# Patient Record
Sex: Female | Born: 1950
Health system: Southern US, Community
[De-identification: ages and names within clinical notes are randomized; demographics above are authoritative.]

## PROBLEM LIST (undated history)

## (undated) DIAGNOSIS — N189 Chronic kidney disease, unspecified: Secondary | ICD-10-CM

## (undated) DIAGNOSIS — G4730 Sleep apnea, unspecified: Secondary | ICD-10-CM

## (undated) DIAGNOSIS — I10 Essential (primary) hypertension: Secondary | ICD-10-CM

## (undated) DIAGNOSIS — E039 Hypothyroidism, unspecified: Secondary | ICD-10-CM

## (undated) DIAGNOSIS — F411 Generalized anxiety disorder: Secondary | ICD-10-CM

## (undated) DIAGNOSIS — K219 Gastro-esophageal reflux disease without esophagitis: Secondary | ICD-10-CM

## (undated) DIAGNOSIS — E109 Type 1 diabetes mellitus without complications: Secondary | ICD-10-CM

## (undated) HISTORY — DX: Sleep apnea, unspecified: G47.30

## (undated) HISTORY — DX: Hypothyroidism, unspecified: E03.9

## (undated) HISTORY — PX: KNEE ARTHROSCOPY: SHX127

## (undated) HISTORY — DX: Type 1 diabetes mellitus without complications: E10.9

## (undated) HISTORY — DX: Generalized anxiety disorder: F41.1

## (undated) HISTORY — DX: Chronic kidney disease, unspecified: N18.9

## (undated) HISTORY — DX: Gastro-esophageal reflux disease without esophagitis: K21.9

## (undated) HISTORY — DX: Essential (primary) hypertension: I10

---

## 1999-06-09 ENCOUNTER — Encounter: Admission: RE | Admit: 1999-06-09 | Discharge: 1999-09-07 | Payer: Self-pay | Admitting: Family Medicine

## 2001-12-10 ENCOUNTER — Other Ambulatory Visit: Admission: RE | Admit: 2001-12-10 | Discharge: 2001-12-10 | Payer: Self-pay | Admitting: Obstetrics and Gynecology

## 2003-09-04 ENCOUNTER — Encounter: Admission: RE | Admit: 2003-09-04 | Discharge: 2003-09-04 | Payer: Self-pay | Admitting: Cardiology

## 2005-08-18 IMAGING — CR DG CHEST 2V
2 series · 2 of 2 positions shown · non-contrast
Comparison: none

CLINICAL DATA: Chest pain.  History of asthma. 
 TWO VIEW CHEST: 
 Normal sized heart.  Minimal diffuse peribronchial thickening.  Lower thoracic spine degenerative changes and mild scoliosis.

[view not recorded (1 of 2)]
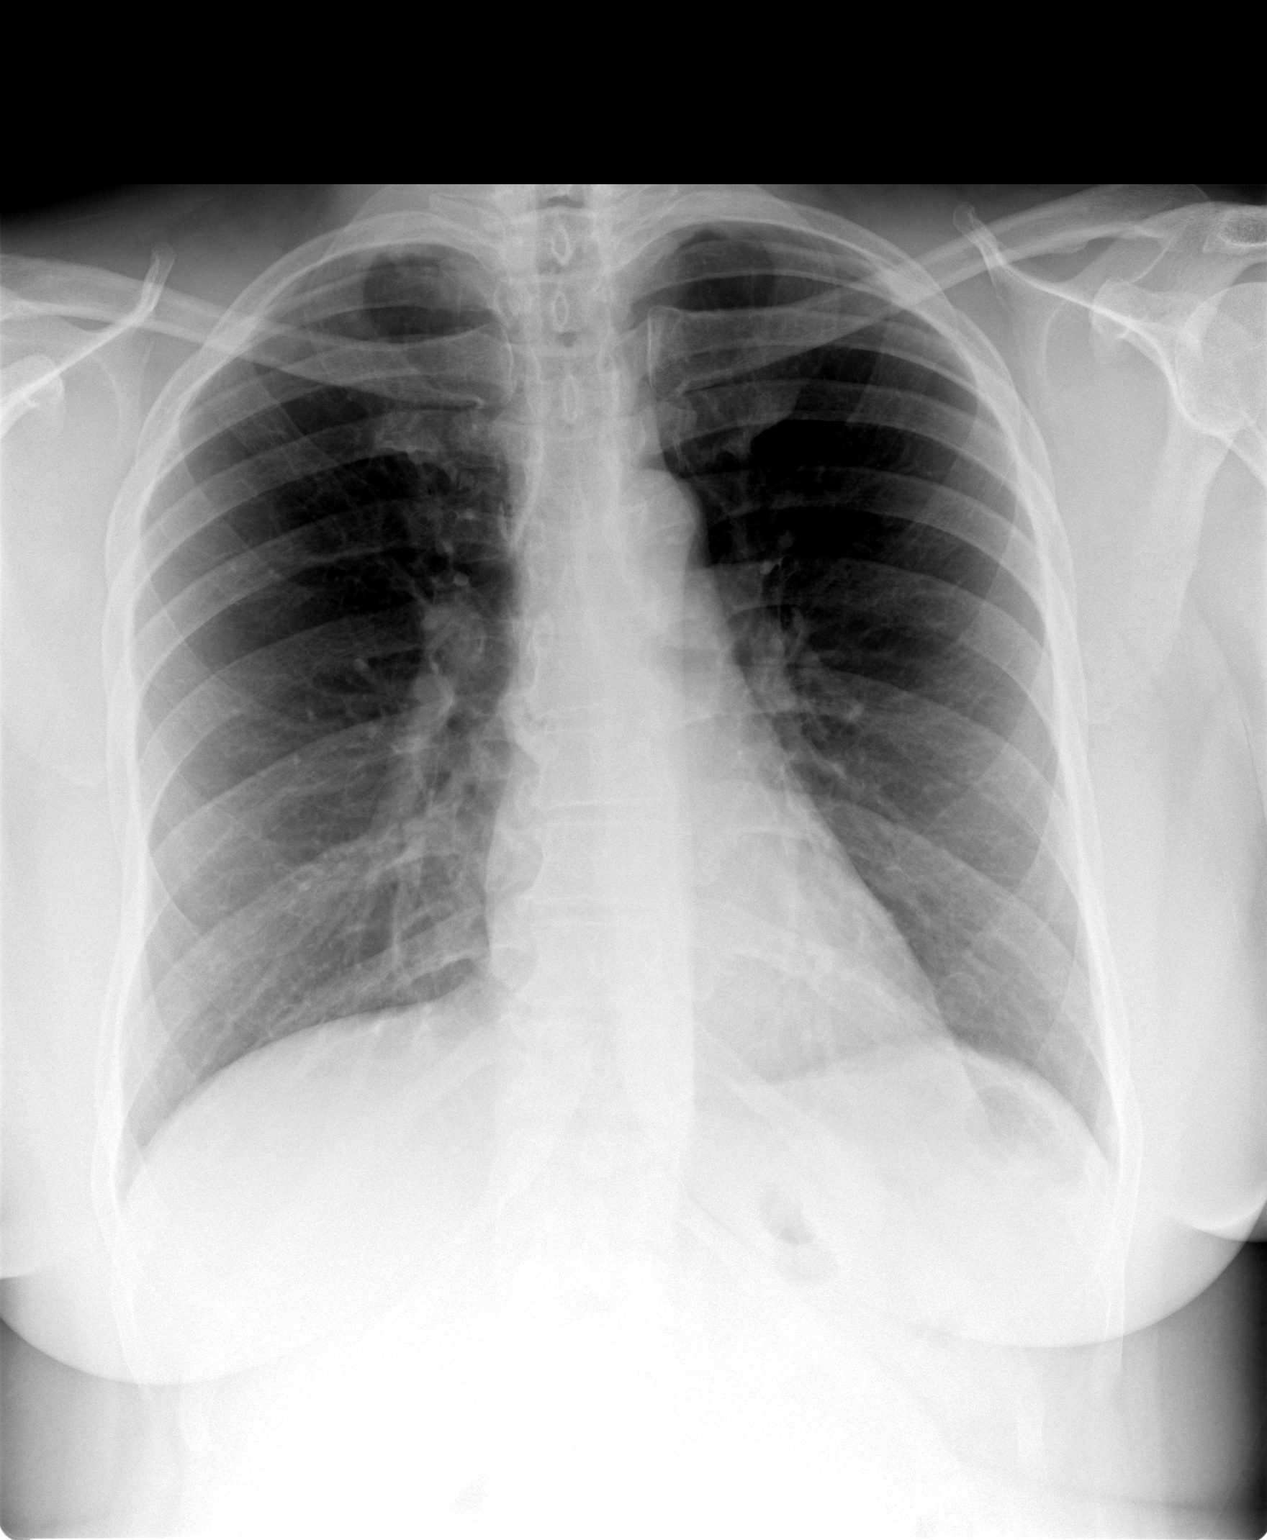

[view not recorded (2 of 2)]
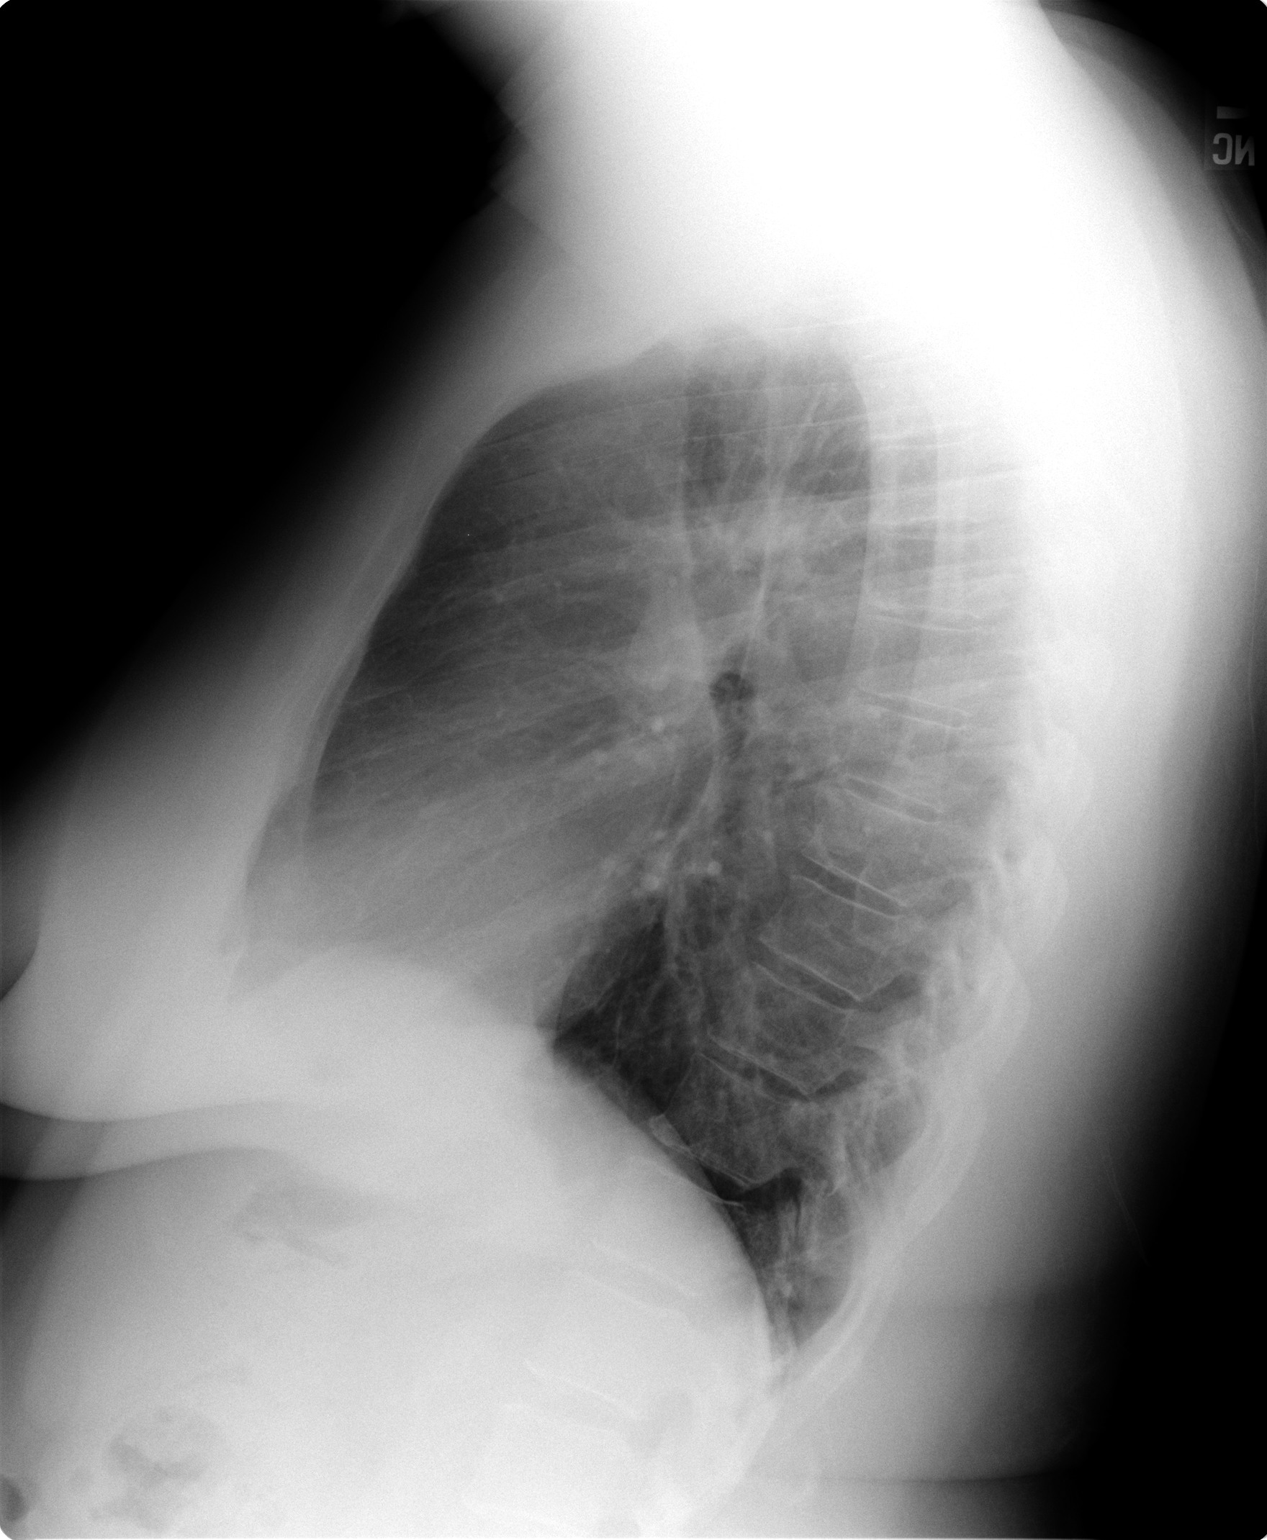

[2 of 2 positions shown; findings below may reference images not displayed]

IMPRESSION: Minimal bronchitic changes.

## 2006-06-15 ENCOUNTER — Ambulatory Visit (HOSPITAL_BASED_OUTPATIENT_CLINIC_OR_DEPARTMENT_OTHER): Admission: RE | Admit: 2006-06-15 | Discharge: 2006-06-15 | Payer: Self-pay | Admitting: Orthopedic Surgery

## 2006-06-23 ENCOUNTER — Encounter: Admission: RE | Admit: 2006-06-23 | Discharge: 2006-09-21 | Payer: Self-pay | Admitting: Orthopedic Surgery

## 2007-12-24 ENCOUNTER — Encounter: Admission: RE | Admit: 2007-12-24 | Discharge: 2007-12-24 | Payer: Self-pay | Admitting: Family Medicine

## 2008-06-16 ENCOUNTER — Encounter: Admission: RE | Admit: 2008-06-16 | Discharge: 2008-06-16 | Payer: Self-pay | Admitting: Family Medicine

## 2009-06-15 ENCOUNTER — Encounter: Payer: Self-pay | Admitting: Endocrinology

## 2009-07-17 ENCOUNTER — Encounter: Payer: Self-pay | Admitting: Endocrinology

## 2009-07-17 DIAGNOSIS — F411 Generalized anxiety disorder: Secondary | ICD-10-CM

## 2009-07-17 DIAGNOSIS — I10 Essential (primary) hypertension: Secondary | ICD-10-CM

## 2009-07-17 HISTORY — DX: Generalized anxiety disorder: F41.1

## 2009-07-17 HISTORY — DX: Essential (primary) hypertension: I10

## 2009-08-13 ENCOUNTER — Ambulatory Visit: Payer: Self-pay | Admitting: Endocrinology

## 2009-09-17 ENCOUNTER — Ambulatory Visit: Payer: Self-pay | Admitting: Endocrinology

## 2009-09-17 DIAGNOSIS — E039 Hypothyroidism, unspecified: Secondary | ICD-10-CM

## 2009-09-17 DIAGNOSIS — K219 Gastro-esophageal reflux disease without esophagitis: Secondary | ICD-10-CM

## 2009-09-17 DIAGNOSIS — E109 Type 1 diabetes mellitus without complications: Secondary | ICD-10-CM | POA: Insufficient documentation

## 2009-09-17 HISTORY — DX: Gastro-esophageal reflux disease without esophagitis: K21.9

## 2009-09-17 HISTORY — DX: Hypothyroidism, unspecified: E03.9

## 2009-09-17 HISTORY — DX: Type 1 diabetes mellitus without complications: E10.9

## 2009-11-02 ENCOUNTER — Ambulatory Visit: Payer: Self-pay | Admitting: Endocrinology

## 2009-11-24 ENCOUNTER — Ambulatory Visit: Payer: Self-pay | Admitting: Endocrinology

## 2009-12-21 ENCOUNTER — Ambulatory Visit: Payer: Self-pay | Admitting: Endocrinology

## 2010-01-21 ENCOUNTER — Ambulatory Visit: Payer: Self-pay | Admitting: Endocrinology

## 2010-02-27 ENCOUNTER — Encounter: Payer: Self-pay | Admitting: Cardiology

## 2010-03-09 NOTE — Assessment & Plan Note (Signed)
Summary: 2-3 wk f/u #/cd   Vital Signs:  Patient profile:   60 year old female Height:      66 inches (167.64 cm) Weight:      248 pounds (112.73 kg) BMI:     40.17 O2 Sat:      97 % on Room air Temp:     98.2 degrees F (36.78 degrees C) oral Pulse rate:   85 / minute BP sitting:   120 / 84  (left arm) Cuff size:   large  Vitals Entered By: Brenton Grills MA (November 24, 2009 10:47 AM)  O2 Flow:  Room air CC: 3 week F/U/aj Is Patient Diabetic? Yes   Primary Provider:  bland  CC:  3 week F/U/aj.  History of Present Illness: she brings a record of her cbg's which i have reviewed today.  she checks in am (200's) and in the afternoon (100's) only.  she feels better in general recently.    Current Medications (verified): 1)  Ferrous Sulfate 325 (65 Fe) Mg Tabs (Ferrous Sulfate) .Marland Kitchen.. 1 By Mouth Qd 2)  Nexium 40 Mg Cpdr (Esomeprazole Magnesium) .Marland Kitchen.. 1 By Mouth Qd 3)  Singulair 10 Mg Tabs (Montelukast Sodium) .Marland Kitchen.. 1 By Mouth Qhs 4)  Synthroid .Marland Kitchen.. 1 By Mouth Qd 5)  Levemir Flexpen 100 Unit/ml Soln (Insulin Detemir) .... 40 Units At Bedtime 6)  Diovan 160 Mg Tabs (Valsartan) .... Take 1 Tablet By Mouth Once Daily 7)  Novolog Flexpen 100 Unit/ml Soln (Insulin Aspart) .... Three Times A Day (Just Before Each Meal), 20-10-15 Units, and Pen Needles 4x A Day 8)  Crestor 10 Mg Tabs (Rosuvastatin Calcium) .Marland Kitchen.. 1 By Mouth Once Daily 9)  Freestyle Lite Test  Strp (Glucose Blood) .... Two Times A Day, and Lancets 250.01  Allergies (verified): 1)  ! Sulfa  Past History:  Past Medical History: Last updated: 09/17/2009 GERD (ICD-530.81) IDDM (ICD-250.01) HYPOTHYROIDISM (ICD-244.9) ANXIETY DISORDER (ICD-300.00) ESSENTIAL HYPERTENSION, BENIGN (ICD-401.1)  Review of Systems  The patient denies hypoglycemia.    Physical Exam  General:  normal appearance.   Psych:  Alert and cooperative; normal mood and affect; normal attention span and concentration.     Impression &  Recommendations:  Problem # 1:  IDDM (ICD-250.01) needs increased rx  Medications Added to Medication List This Visit: 1)  Novolog Flexpen 100 Unit/ml Soln (Insulin aspart) .... Three times a day (just before each meal), 20-10-25 units, and pen needles 4x a day  Other Orders: Est. Patient Level III (16109)  Patient Instructions: 1)  check your blood sugar 2 times a day.  vary the time of day when you check, between before the 3 meals, and at bedtime.  also check if you have symptoms of your blood sugar being too high or too low.  please keep a record of the readings and bring it to your next appointment here.  please call us sooner if you are having low blood sugar episodes. 2)  for now, continue levemir 40 units at bedtime, and increase novolog to three times a day (just before each meal) 20-10-25 units. 3)  Please schedule a follow-up appointment in 3 weeks. Prescriptions: LEVEMIR FLEXPEN 100 UNIT/ML SOLN (INSULIN DETEMIR) 40 units at bedtime  #3 boxes x 3   Entered and Authorized by:   Minus Breeding MD   Signed by:   Minus Breeding MD on 11/24/2009   Method used:   Print then Give to Patient   RxID:   [id number]  Orders Added: 1)  Est. Patient Level III [81859]

## 2010-03-09 NOTE — Miscellaneous (Signed)
Summary: Preload  Clinical Lists Changes  Problems: Added new problem of DIABETES (ICD-V18.0) Added new problem of ESSENTIAL HYPERTENSION, BENIGN (ICD-401.1) Added new problem of ANXIETY DISORDER (ICD-300.00) Medications: Added new medication of FERROUS SULFATE 325 (65 FE) MG TABS (FERROUS SULFATE) 1 by mouth qd Added new medication of FORTAMET 1000 MG XR24H-TAB (METFORMIN HCL) 2 by mouth qd Added new medication of NEXIUM 40 MG CPDR (ESOMEPRAZOLE MAGNESIUM) 1 by mouth qd Added new medication of SINGULAIR 10 MG TABS (MONTELUKAST SODIUM) 1 by mouth qhs Added new medication of * SYNTHROID 1 by mouth qd Added new medication of VICTOZA 18 MG/3ML SOLN (LIRAGLUTIDE) as directed Added new medication of ZMAX 2 GM SUSR (AZITHROMYCIN) take all as directed Allergies: Added new allergy or adverse reaction of SULFA Observations: Added new observation of MEDRECON: current updated (07/17/2009 8:29) Added new observation of PAST MED HX: Anxiety Disorder  (07/17/2009 8:29) Added new observation of PMH ANXIETY: yes (07/17/2009 8:29) Added new observation of PAST SURG HX: none (07/17/2009 8:29) Added new observation of DRUG USE: never (07/17/2009 8:29) Added new observation of SOCIAL HX: Marital Status:  Married 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  Alcohol Use - yes  (07/17/2009 8:29) Added new observation of ALCOHOL COMM: yes (07/17/2009 8:29) Added new observation of SMOK STATUS: never (07/17/2009 8:29) Added new observation of FAMILY HX: Diabetes-Mother and Brother Heart Disease-Father, Mother, and Sister Kidney Disease- Brother on dialysis  (07/17/2009 8:29) Added new observation of FH DIABETES: yes (07/17/2009 8:29) Added new observation of ALLERGY REV: Done (07/17/2009 8:29) Added new observation of NKA: F (07/17/2009 8:29)       Current Medications (verified): 1)  Ferrous Sulfate 325 (65 Fe) Mg Tabs (Ferrous Sulfate) .Marland Kitchen.. 1 By Mouth Qd 2)  Fortamet 1000 Mg Xr24h-Tab  (Metformin Hcl) .... 2 By Mouth Qd 3)  Nexium 40 Mg Cpdr (Esomeprazole Magnesium) .Marland Kitchen.. 1 By Mouth Qd 4)  Singulair 10 Mg Tabs (Montelukast Sodium) .Marland Kitchen.. 1 By Mouth Qhs 5)  Synthroid .Marland Kitchen.. 1 By Mouth Qd 6)  Victoza 18 Mg/65ml Soln (Liraglutide) .... As Directed 7)  Zmax 2 Gm Susr (Azithromycin) .... Take All As Directed  Allergies (verified): 1)  ! 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  Alcohol Use - yes Smoking Status:  never Drug Use:  never

## 2010-03-09 NOTE — Assessment & Plan Note (Signed)
Summary: 3-4 wk rov /nws   Vital Signs:  Patient profile:   60 year old female Height:      66 inches (167.64 cm) Weight:      254.13 pounds (115.51 kg) BMI:     41.17 O2 Sat:      96 % on Room air Temp:     98.1 degrees F (36.72 degrees C) oral Pulse rate:   96 / minute BP sitting:   126 / 82  (left arm) Cuff size:   large  Vitals Entered By: Brenton Grills CMA Duncan Dull) (December 21, 2009 10:13 AM)  O2 Flow:  Room air CC: Follow-up visit/aj Is Patient Diabetic? Yes   Primary Provider:  bland  CC:  Follow-up visit/aj.  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   she brings a record of her cbg's which i have reviewed today.  it varies from 180-300, with no trend throughout the day.    Current Medications (verified): 1)  Ferrous Sulfate 325 (65 Fe) Mg Tabs (Ferrous Sulfate) .Marland Kitchen.. 1 By Mouth Qd 2)  Nexium 40 Mg Cpdr (Esomeprazole Magnesium) .Marland Kitchen.. 1 By Mouth Qd 3)  Singulair 10 Mg Tabs (Montelukast Sodium) .Marland Kitchen.. 1 By Mouth Qhs 4)  Synthroid .Marland Kitchen.. 1 By Mouth Qd 5)  Levemir Flexpen 100 Unit/ml Soln (Insulin Detemir) .... 40 Units At Bedtime 6)  Diovan 160 Mg Tabs (Valsartan) .... Take 1 Tablet By Mouth Once Daily 7)  Novolog Flexpen 100 Unit/ml Soln (Insulin Aspart) .... Three Times A Day (Just Before Each Meal), 20-10-25 Units, and Pen Needles 4x A Day 8)  Crestor 10 Mg Tabs (Rosuvastatin Calcium) .Marland Kitchen.. 1 By Mouth Once Daily 9)  Freestyle Lite Test  Strp (Glucose Blood) .... Two Times A Day, and Lancets 250.01  Allergies (verified): 1)  ! Sulfa  Past History:  Past Medical History: Last updated: 09/17/2009 GERD (ICD-530.81) IDDM (ICD-250.01) HYPOTHYROIDISM (ICD-244.9) ANXIETY DISORDER (ICD-300.00) ESSENTIAL HYPERTENSION, BENIGN (ICD-401.1)  Review of Systems  The patient denies hypoglycemia.    Physical Exam  General:  obese.  no  distress  Psych:  Alert and cooperative; normal mood and affect; normal attention span and concentration.     Impression & Recommendations:  Problem # 1:  IDDM (ICD-250.01) the multiple injection insulin regimen is not working with her work schedule.  Medications Added to Medication List This Visit: 1)  Novolog Mix 70/30 Flexpen 70-30 % Susp (Insulin aspart prot & aspart) .... 60 units with breakfast, and 35 units with evening meal, and pen needles two times a day.  Other Orders: Est. Patient Level III (04540)  Patient Instructions: 1)  check your blood sugar 2 times a day.  vary the time of day when you check, between before the 3 meals, and at bedtime.  also check if you have symptoms of your blood sugar being too high or too low.  please keep a record of the readings and bring it to your next appointment here.  please call us sooner if you are having low blood sugar episodes. 2)  change both current insulins to novolog mix, 60 units with breakfast, and 35 units with the evening meal.  call sooner if blood sugar continues to be high.   3)  Please schedule a follow-up appointment in 1 month. Prescriptions: NOVOLOG MIX 70/30 FLEXPEN 70-30 % SUSP (INSULIN ASPART PROT &  ASPART) 60 units with breakfast, and 35 units with evening meal, and pen needles two times a day.  #7 vials x 3   Entered and Authorized by:   Minus Breeding MD   Signed by:   Minus Breeding MD on 12/21/2009   Method used:   Print then Give to Patient   RxID:   [id number]    Orders Added: 1)  Est. Patient Level III [02725]

## 2010-03-09 NOTE — Assessment & Plan Note (Signed)
Summary: 3 WK ROV /NWS  #   Vital Signs:  Patient profile:   60 year old female Height:      66 inches (167.64 cm) Weight:      248.25 pounds (112.84 kg) BMI:     40.21 O2 Sat:      97 % on Room air Temp:     98.3 degrees F (36.83 degrees C) oral Pulse rate:   68 / minute BP sitting:   128 / 82  (left arm) Cuff size:   large  Vitals Entered By: Brenton Grills MA (November 02, 2009 10:47 AM)  O2 Flow:  Room air CC: 3 week F/U/aj Is Patient Diabetic? Yes   Primary Provider:  bland  CC:  3 week F/U/aj.  History of Present Illness: pt says she does not like the aftertaste of metformin.  she brings a record of her cbg's which i have reviewed today.  all are in the 200's, except 100's in the afternoon.    Current Medications (verified): 1)  Ferrous Sulfate 325 (65 Fe) Mg Tabs (Ferrous Sulfate) .Marland Kitchen.. 1 By Mouth Qd 2)  Fortamet 1000 Mg Xr24h-Tab (Metformin Hcl) .... 2 By Mouth Qd 3)  Nexium 40 Mg Cpdr (Esomeprazole Magnesium) .Marland Kitchen.. 1 By Mouth Qd 4)  Singulair 10 Mg Tabs (Montelukast Sodium) .Marland Kitchen.. 1 By Mouth Qhs 5)  Synthroid .Marland Kitchen.. 1 By Mouth Qd 6)  Levemir Flexpen 100 Unit/ml Soln (Insulin Detemir) .... 40 Units At Bedtime 7)  Diovan 160 Mg Tabs (Valsartan) .... Take 1 Tablet By Mouth Once Daily 8)  Novolog Flexpen 100 Unit/ml Soln (Insulin Aspart) .Marland Kitchen.. 10 Units Three Times A Day (Just Before Each Meal), and Pen Needles 4x A Day 9)  Crestor 10 Mg Tabs (Rosuvastatin Calcium) .Marland Kitchen.. 1 By Mouth Once Daily  Allergies (verified): 1)  ! Sulfa  Past History:  Past Medical History: Last updated: 09/17/2009 GERD (ICD-530.81) IDDM (ICD-250.01) HYPOTHYROIDISM (ICD-244.9) ANXIETY DISORDER (ICD-300.00) ESSENTIAL HYPERTENSION, BENIGN (ICD-401.1)  Review of Systems  The patient denies hypoglycemia.    Physical Exam  General:  obese.  no distress  Psych:  Alert and cooperative; normal mood and affect; normal attention span and concentration.     Impression &  Recommendations:  Problem # 1:  IDDM (ICD-250.01) needs increased rx  Medications Added to Medication List This Visit: 1)  Novolog Flexpen 100 Unit/ml Soln (Insulin aspart) .... Three times a day (just before each meal), 20-10-15 units, and pen needles 4x a day 2)  Crestor 10 Mg Tabs (Rosuvastatin calcium) .Marland Kitchen.. 1 by mouth once daily 3)  Freestyle Lite Test Strp (Glucose blood) .... Two times a day, and lancets 250.01  Other Orders: Admin 1st Vaccine (47829) Flu Vaccine 47yrs + (56213) Est. Patient Level III (08657)  Patient Instructions: 1)  check your blood sugar 2 times a day.  vary the time of day when you check, between before the 3 meals, and at bedtime.  also check if you have symptoms of your blood sugar being too high or too low.  please keep a record of the readings and bring it to your next appointment here.  please call us sooner if you are having low blood sugar episodes. 2)  for now, continue levemir 40 units at bedtime, and add novolog three times a day (just before each meal) 20-10-15 units 3)  Please schedule a follow-up appointment in 2-3 weeks. Prescriptions: FREESTYLE LITE TEST  STRP (GLUCOSE BLOOD) two times a day, and lancets 250.01  #180 x 3  Entered and Authorized by:   Minus Breeding MD   Signed by:   Minus Breeding MD on 11/02/2009   Method used:   Print then Give to Patient   RxID:   [id number] LEVEMIR FLEXPEN 100 UNIT/ML SOLN (INSULIN DETEMIR) 40 units at bedtime  #3 boxes x 3   Entered and Authorized by:   Minus Breeding MD   Signed by:   Minus Breeding MD on 11/02/2009   Method used:   Print then Give to Patient   RxID:   [id number] NOVOLOG FLEXPEN 100 UNIT/ML SOLN (INSULIN ASPART) three times a day (just before each meal), 20-10-15 units, and pen needles 4x a day  #4 boxes x 3   Entered and Authorized by:   Minus Breeding MD   Signed by:   Minus Breeding MD on 11/02/2009   Method used:   Print then Give to Patient   RxID:    [id number]    Flu Vaccine Consent Questions     Do you have a history of severe allergic reactions to this vaccine? no    Any prior history of allergic reactions to egg and/or gelatin? no    Do you have a sensitivity to the preservative Thimersol? no    Do you have a past history of Guillan-Barre Syndrome? no    Do you currently have an acute febrile illness? no    Have you ever had a severe reaction to latex? no    Vaccine information given and explained to patient? yes    Are you currently pregnant? no    Lot Number:[id number]   Exp Date:08/07/2010   Site Given  Left Deltoid IMbflu

## 2010-03-09 NOTE — Assessment & Plan Note (Signed)
Summary: NEW ENDO CONSULT/DM 2/LB   Vital Signs:  Patient profile:   60 year old female Height:      66 inches (167.64 cm) Weight:      253 pounds (115.00 kg) BMI:     40.98 O2 Sat:      98 % on Room air Temp:     97.6 degrees F (36.44 degrees C) oral Pulse rate:   97 / minute BP sitting:   114 / 78  (left arm) Cuff size:   large  Vitals Entered By: Brenton Grills MA (September 17, 2009 1:21 PM)  O2 Flow:  Room air CC: New Endo pt/DM II/aj/pt is no longer taking Victoza or Zmax/aj Is Patient Diabetic? Yes   CC:  New Endo pt/DM II/aj/pt is no longer taking Victoza or Zmax/aj.  History of Present Illness: pt states 3 years h/o dm.  she is unaware of any chronic complications.  she has been on insulin x 2 years.  she takes levemir 50 units qhs.  no cbg record, but states cbg's are 200's.  it is in genreal higher as the day goes on.  she did not tolerate victoza, due to nausea. pt says her diet and exercise are "fair to good."  she says her exercise is limited by sxs.   symptomatically, pt states few years of slight right knee pain, and associated fatigue.   Current Medications (verified): 1)  Ferrous Sulfate 325 (65 Fe) Mg Tabs (Ferrous Sulfate) .Marland Kitchen.. 1 By Mouth Qd 2)  Fortamet 1000 Mg Xr24h-Tab (Metformin Hcl) .... 2 By Mouth Qd 3)  Nexium 40 Mg Cpdr (Esomeprazole Magnesium) .Marland Kitchen.. 1 By Mouth Qd 4)  Singulair 10 Mg Tabs (Montelukast Sodium) .Marland Kitchen.. 1 By Mouth Qhs 5)  Synthroid .Marland Kitchen.. 1 By Mouth Qd 6)  Victoza 18 Mg/62ml Soln (Liraglutide) .... As Directed 7)  Zmax 2 Gm Susr (Azithromycin) .... Take All As Directed 8)  Levemir Flexpen 100 Unit/ml Soln (Insulin Detemir) .... 50 Units At Bedtime 9)  Diovan 160 Mg Tabs (Valsartan) .... Take 1 Tablet By Mouth Once Daily  Allergies (verified): 1)  ! Sulfa  Past History:  Past Medical History: GERD (ICD-530.81) IDDM (ICD-250.01) HYPOTHYROIDISM (ICD-244.9) ANXIETY DISORDER (ICD-300.00) ESSENTIAL HYPERTENSION, BENIGN (ICD-401.1)  Family  History: Reviewed history from 07/17/2009 and no changes required. Diabetes-Mother and Brother Heart Disease-Father, Mother, and Sister Kidney Disease- Brother on dialysis  Social History: Reviewed history from 07/17/2009 and no changes required. 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  Alcohol Use - yes  Review of Systems       The patient complains of weight gain.         denies blurry vision, headache, chest pain, excessive diaphoresis, memory loss, depression, and menopausal sxs.  nausea is resolved since off victoza.  she has slight doe--she attributes to allergic probs and uri's.  she has polyuria, rhinorrhea, easy bruising, and leg cramps.   Physical Exam  General:  morbidly obese.  no distress  Head:  head: no deformity eyes: no periorbital swelling, no proptosis external nose and ears are normal mouth: no lesion seen Neck:  Supple without thyroid enlargement or tenderness.   Lungs:  Clear to auscultation bilaterally. Normal respiratory effort.  Heart:  Regular rate and rhythm without murmurs or gallops noted. Normal S1,S2.   Abdomen:  abdomen is soft, nontender.  no hepatosplenomegaly.   not distended.  no hernia  Msk:  muscle bulk and strength are grossly normal.  no obvious joint  swelling.  gait is normal and steady  Pulses:  dorsalis pedis intact bilat.  no carotid bruit  Extremities:  no deformity.  no ulcer on the feet.  feet are of normal color and temp.  no edema  Neurologic:  cn 2-12 grossly intact.   readily moves all 4's.   sensation is intact to touch on the feet  Skin:  normal texture and temp.  no rash.  not diaphoretic  Cervical Nodes:  No significant adenopathy.  Psych:  Alert and cooperative; normal mood and affect; normal attention span and concentration.   Additional Exam:  outside test results are reviewed:  a1c (may, 2011) 11%   Impression & Recommendations:  Problem # 1:  IDDM  (ICD-250.01)  Orders: Consultation Level IV (04540)  Problem # 2:  knee pain this limits exercise rx  Problem # 3:  nausea was apparently due to victoza  Medications Added to Medication List This Visit: 1)  Levemir Flexpen 100 Unit/ml Soln (Insulin detemir) .... 50 units at bedtime 2)  Levemir Flexpen 100 Unit/ml Soln (Insulin detemir) .... 40 units at bedtime 3)  Diovan 160 Mg Tabs (Valsartan) .... Take 1 tablet by mouth once daily 4)  Novolog Flexpen 100 Unit/ml Soln (Insulin aspart) .Marland Kitchen.. 10 units three times a day (just before each meal), and pen needles 4x a day  Patient Instructions: 1)  good diet and exercise habits significanly improve the control of your diabetes.  please let me know if you wish to be referred to a dietician.  you should also consider weight-loss surgery.  high blood sugar is very risky to your health.  you should see an eye doctor every year. 2)  controlling your blood pressure and cholesterol drastically reduces the damage diabetes does to your body.  this also applies to quitting smoking.  please discuss these with your doctor.  you should take an aspirin every day, unless you have been advised by a doctor not to. 3)  check your blood sugar 2 times a day.  vary the time of day when you check, between before the 3 meals, and at bedtime.  also check if you have symptoms of your blood sugar being too high or too low.  please keep a record of the readings and bring it to your next appointment here.  please call us sooner if you are having low blood sugar episodes. 4)  for now, reduce levemir to 40 units at bedtime, and add novolog 10 units three times a day (just before each meal) 5)  Please schedule a follow-up appointment in 3 weeks. Prescriptions: LEVEMIR FLEXPEN 100 UNIT/ML SOLN (INSULIN DETEMIR) 40 units at bedtime  #1 box x 3   Entered and Authorized by:   Minus Breeding MD   Signed by:   Minus Breeding MD on 09/17/2009   Method used:   Print then Give to  Patient   RxID:   [id number] NOVOLOG FLEXPEN 100 UNIT/ML SOLN (INSULIN ASPART) 10 units three times a day (just before each meal), and pen needles 4x a day  #3 boxes x 3   Entered and Authorized by:   Minus Breeding MD   Signed by:   Minus Breeding MD on 09/17/2009   Method used:   Print then Give to Patient   RxID:   [id number]

## 2010-05-31 IMAGING — CR DG HAND COMPLETE 3+V*R*
3 series · 3 of 3 positions shown · non-contrast
Comparison: None.

CLINICAL DATA: Fall with swelling and pain in the first and second
digits.

RIGHT HAND - COMPLETE 3+ VIEW

[view not recorded (1 of 3)]
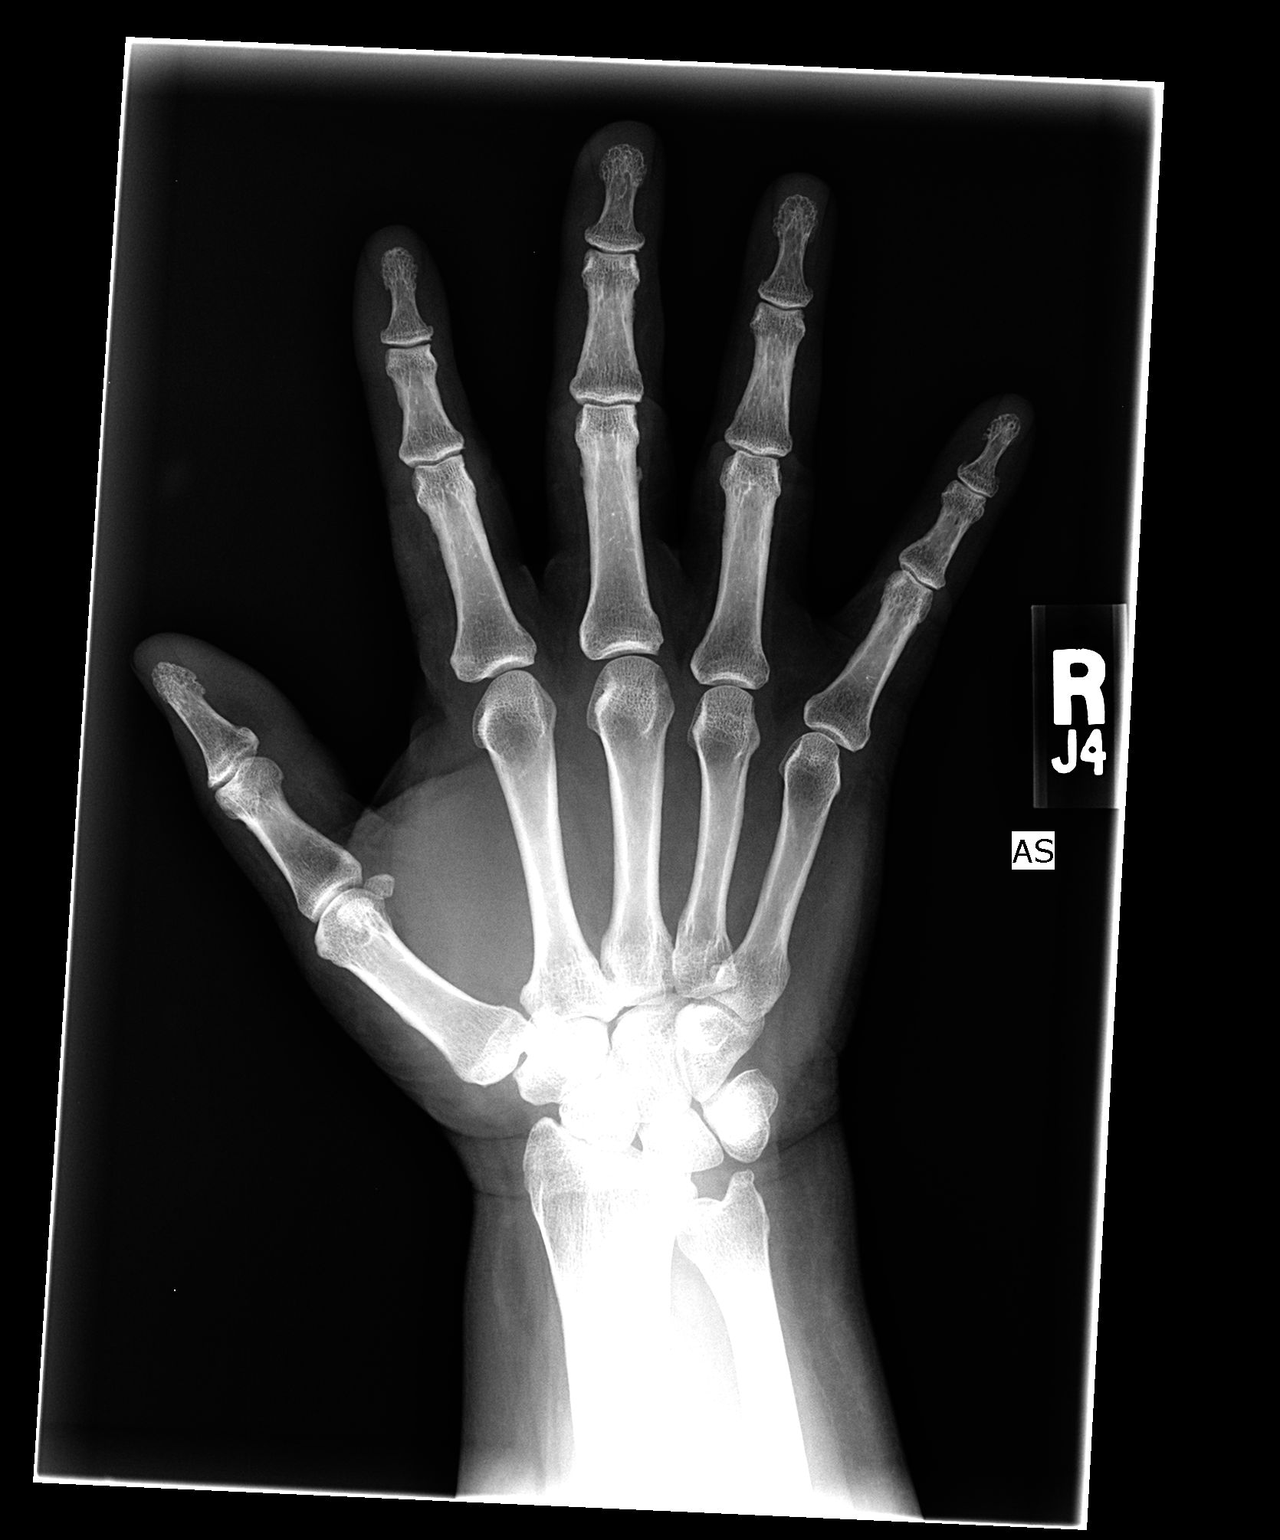

[view not recorded (2 of 3)]
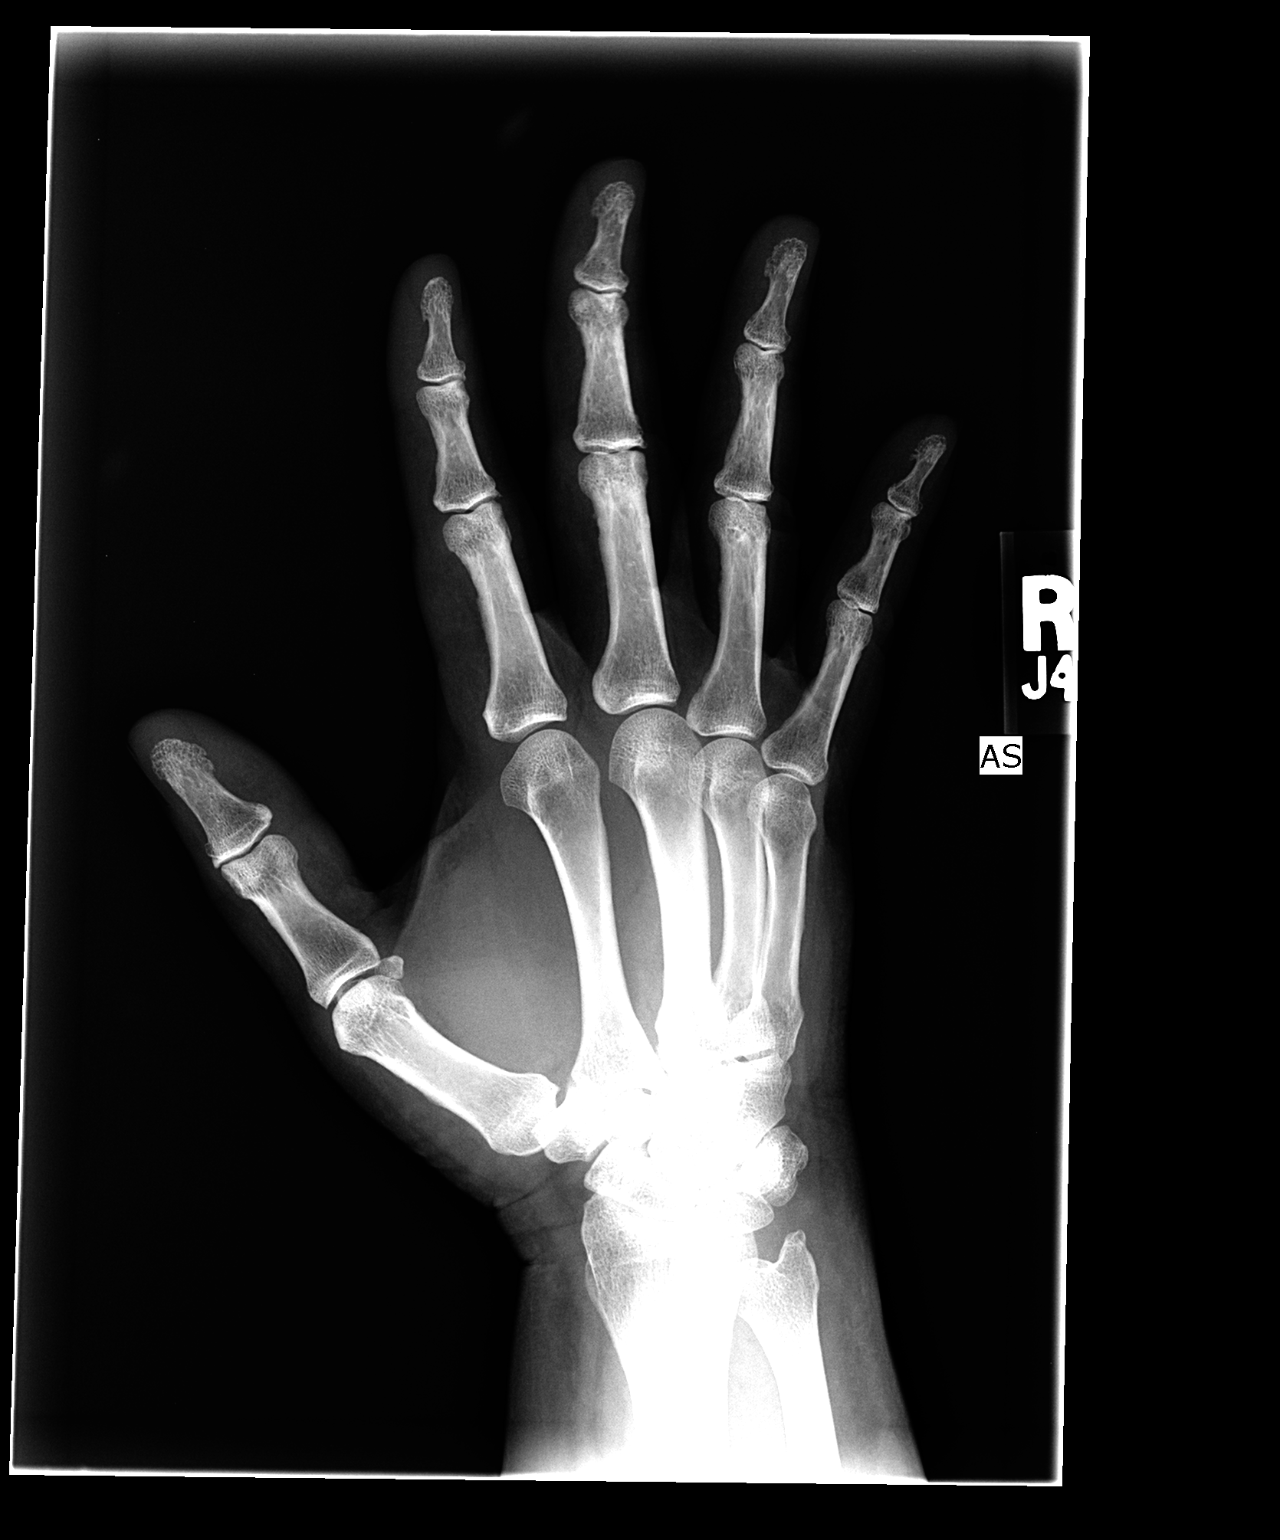

[view not recorded (3 of 3)]
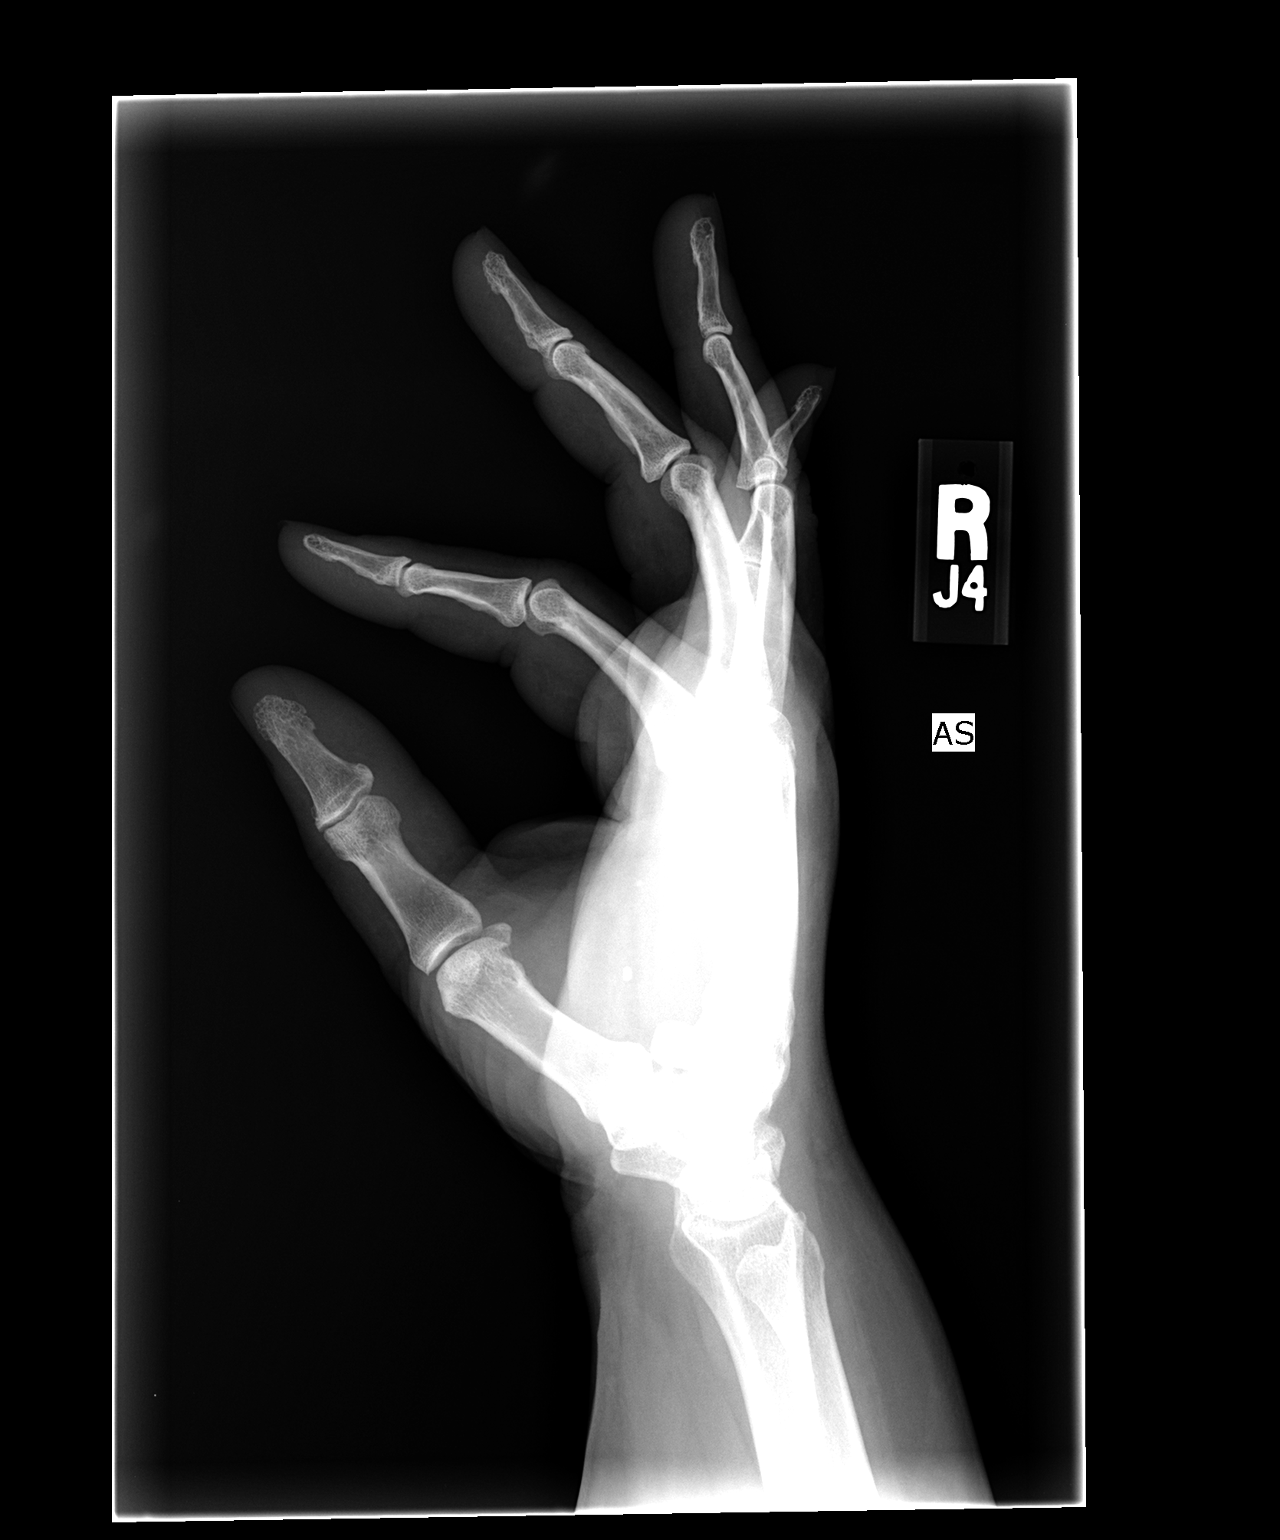

[3 of 3 positions shown; findings below may reference images not displayed]

FINDINGS: Soft tissues of the first, second, third and fourth
fingers are edematous.  No underlying fracture.  There may be mild
degenerative changes in the second and third proximal
interphalangeal joints.
IMPRESSION: Soft tissue swelling without acute fracture.

## 2010-06-22 NOTE — Op Note (Signed)
Carrie Solis, Carrie Solis               ACCOUNT NO.:  [id number]   MEDICAL RECORD NO.:  [id number]          PATIENT TYPE:  AMB   LOCATION:  DSC                          FACILITY:  [facility]   PHYSICIAN:  Rodney A. Mortenson, M.D.DATE OF BIRTH:  Apr 08, 1950   DATE OF PROCEDURE:  06/15/2006  DATE OF DISCHARGE:                               OPERATIVE REPORT   JUSTIFICATION:  A 60 year old female with a 5 to 6 week history of  diffuse right knee pain.  No injury that she can recall.  She does have  trouble with stairs. When sits for any length of time is quite  comfortable.  Turning right or left suddenly causes pain in the knee.  There is acute tenderness along the mid-third and posterior third of the  medial joint line.  MRI is does and this shows tear of the posterior  horn, medial meniscus which extends on the inferior surface.  Some  degenerative changes seen throughout the knee.  Persistent pain and  discomfort without resolution.  She is now admitted for arthroscopic  evaluation and treatment.  Complications discussed preoperatively.  Questions answered and encouraged.   Justification of patient's surgical were ________.   PREOPERATIVE DIAGNOSIS:  Tear posterior horn medial meniscus, right knee  inferior surface.   POSTOPERATIVE DIAGNOSIS:  Tear posterior horn medial meniscus, right  knee inferior surface.   OPERATION:  Partial medial meniscectomy, right knee.   SURGEON:  Lenard Galloway. Chaney Malling, M.D.   ANESTHESIA:  MAC.   FINDINGS:  With the arthroscope in the knee very careful examination of  the knee was undertaken.  The patellofemoral junction is visualized with  normal articular cartilage on both sides of the joint.  The lateral  compartment was visualized.  Normal articular cartilage lateral femoral  condyle and lateral tibial plateau and entire circumference of the  lateral meniscus was normal.  In the intercondylar notch area the ACL  was normal.  In the medial compartment the  anterior 2/3 of the medial  meniscus was normal but the superior surface of the posterior horn  appeared normal but underneath the horn it was elevated.  There were  multiple horizontal tears of the inferior surface of the meniscus which  extended out to the capsule.  This was markedly for being torn in this  area.   DESCRIPTION OF PROCEDURE:  The patient was placed on the operating table  in the supine position.  A pneumatic tourniquet about the right upper  thigh.  The entire right lower extremity was prepped with DuraPrep,  draped down in the usual manner.  Marcaine placed in knee and Xylocaine  and epinephrine used to infiltrate the skin wounds.  An infusion cannula  was placed in the superior medial pouch and the knee distended with  saline.  Anteromedian and anterior portals made and the arthroscope was  introduced.  The findings are described as above.  All the pathology  seen in the medial compartment.  Through both medial and lateral portals  a series of baskets were inserted and the posterior horn is very  aggressively debrided.  Once this  was debulked to my satisfaction.  The  intra-articular shaver was introduced.  All debris was removed.  The  remaining remnants ________ mid-third of the  medial meniscus.  Excellent decompression was then achieved.  Marcaine was then placed and  a large _______ of dressing applied, and the patient returned to the  recovery room in excellent condition.  The patient did extremely well  and was very pleased with the surgical outcome.   DISPOSITION:  1. Usual postoperative instructions given.  2. Percocet for pain.  3. Return to my office on Wednesday.           ______________________________  Lenard Galloway. Chaney Malling, M.D.     RAM/MEDQ  D:  06/15/2006  T:  06/15/2006  Job:  [id number]

## 2010-08-27 ENCOUNTER — Encounter: Payer: Self-pay | Admitting: Endocrinology

## 2010-08-27 ENCOUNTER — Other Ambulatory Visit (INDEPENDENT_AMBULATORY_CARE_PROVIDER_SITE_OTHER)

## 2010-08-27 ENCOUNTER — Ambulatory Visit (INDEPENDENT_AMBULATORY_CARE_PROVIDER_SITE_OTHER): Admitting: Endocrinology

## 2010-08-27 VITALS — BP 126/82 | HR 87 | Temp 98.7°F | Ht 66.0 in | Wt 265.0 lb

## 2010-08-27 DIAGNOSIS — E109 Type 1 diabetes mellitus without complications: Secondary | ICD-10-CM

## 2010-08-27 LAB — HEMOGLOBIN A1C: Hgb A1c MFr Bld: 12.9 % — ABNORMAL HIGH (ref 4.6–6.5)

## 2010-08-27 MED ORDER — INSULIN ASPART PROT & ASPART (70-30 MIX) 100 UNIT/ML ~~LOC~~ SUSP: SUBCUTANEOUS | Status: DC

## 2010-08-27 NOTE — Progress Notes (Signed)
  Subjective:    Patient ID: Carrie Solis, female    DOB: [birth year], 60 y.o.   MRN: [id number]  HPI Pt was ill for 3 mos with a series of resp illnesses over the winter.  She was on prednisone, which increased her cbg's.  She has been off prednisone x 5 months.  no cbg record, but states since off the prednisone, cbg's have been 170-230.  She says cbg's are in general higher as the day goes on.   Past Medical History  Diagnosis Date  . GERD 09/17/2009  . HYPOTHYROIDISM 09/17/2009  . IDDM 09/17/2009  . ANXIETY DISORDER 07/17/2009  . Essential hypertension, benign 07/17/2009  . Asthma     No past surgical history on file.  History   Social History  . Marital Status: Married    Spouse Name: N/A    Number of Children: N/A  . Years of Education: N/A   Occupational History  . Social Worker    Social History Main Topics  . Smoking status: Never Smoker   . Smokeless tobacco: Not on file  . Alcohol Use: Yes  . Drug Use:   . Sexually Active: Not Currently   Other Topics Concern  . Not on file   Social History Narrative  . No narrative on file    No current outpatient prescriptions on file prior to visit.    Allergies  Allergen Reactions  . Sulfonamide Derivatives     REACTION: rash    Family History  Problem Relation Age of Onset  . Diabetes Mother   . Heart disease Mother   . Heart disease Father   . Heart disease Sister   . Diabetes Brother   . Kidney disease Brother     on dialysis    BP 126/82  Pulse 87  Temp(Src) 98.7 F (37.1 C) (Oral)  Ht 5\' 6"  (1.676 m)  Wt 265 lb (120.203 kg)  BMI 42.77 kg/m2  SpO2 96% Review of Systems denies hypoglycemia.    Objective:   Physical Exam GENERAL: no distress.  Obese Pulses: dorsalis pedis intact bilat.   Feet: no deformity.  no ulcer on the feet.  feet are of normal color and temp.  no edema Neuro: sensation is intact to touch on the feet     Lab Results  Component Value Date   HGBA1C 12.9* 08/27/2010    Assessment & Plan:  Dm, needs increased rx

## 2010-08-27 NOTE — Patient Instructions (Addendum)
You should continue working towards the weight-loss surgery. blood tests are being ordered for you today.  please call [phone] to hear your test results.  You will be prompted to enter the 9-digit "MRN" number that appears at the top left of this page, followed by #.  Then you will hear the message. pending the test results, please increase the novolog mix to 75 units with breakfast, and 35 units with the evening meal. Please make a follow-up appointment in 3 months. good diet and exercise habits significanly improve the control of your diabetes.  please let me know if you wish to be referred to a dietician.  high blood sugar is very risky to your health.  you should see an eye doctor every year. controlling your blood pressure and cholesterol drastically reduces the damage diabetes does to your body.  this also applies to quitting smoking.  please discuss these with your doctor.  you should take an aspirin every day, unless you have been advised by a doctor not to. (update: i left message on phone-tree:  Increase to 85 units am and 45 units pm).

## 2011-08-25 DIAGNOSIS — G4733 Obstructive sleep apnea (adult) (pediatric): Secondary | ICD-10-CM | POA: Insufficient documentation

## 2011-08-25 DIAGNOSIS — I10 Essential (primary) hypertension: Secondary | ICD-10-CM | POA: Insufficient documentation

## 2011-08-25 DIAGNOSIS — E1169 Type 2 diabetes mellitus with other specified complication: Secondary | ICD-10-CM | POA: Insufficient documentation

## 2011-08-25 DIAGNOSIS — E785 Hyperlipidemia, unspecified: Secondary | ICD-10-CM | POA: Insufficient documentation

## 2011-09-27 ENCOUNTER — Ambulatory Visit (INDEPENDENT_AMBULATORY_CARE_PROVIDER_SITE_OTHER): Admitting: Endocrinology

## 2011-09-27 ENCOUNTER — Encounter: Payer: Self-pay | Admitting: Endocrinology

## 2011-09-27 VITALS — BP 122/80 | HR 89 | Temp 97.9°F | Ht 66.0 in | Wt 249.0 lb

## 2011-09-27 DIAGNOSIS — E109 Type 1 diabetes mellitus without complications: Secondary | ICD-10-CM

## 2011-09-27 MED ORDER — INSULIN DETEMIR 100 UNIT/ML ~~LOC~~ SOLN: 140.0000 [IU] | SUBCUTANEOUS | Status: DC

## 2011-09-27 MED ORDER — GLUCOSE BLOOD VI STRP: ORAL_STRIP | Status: AC

## 2011-09-27 NOTE — Progress Notes (Signed)
Subjective:    Patient ID: Carrie Solis, female    DOB: [birth year], 61 y.o.   MRN: [id number]  HPI Pt returns for f/u of insulin-requiring DM (dx'ed 2008; no known complications).  pt states she feels well in general.  She is working towards having gastric bypass surgery.  no cbg record, but states cbg's are in the 200's and 300's.  There is no trend throughout the day. She has been taking just 60 units qam and 45 units qpm.   Past Medical History  Diagnosis Date  . GERD 09/17/2009  . HYPOTHYROIDISM 09/17/2009  . IDDM 09/17/2009  . ANXIETY DISORDER 07/17/2009  . Essential hypertension, benign 07/17/2009  . Asthma     No past surgical history on file.  History   Social History  . Marital Status: Married    Spouse Name: N/A    Number of Children: N/A  . Years of Education: N/A   Occupational History  . Social Worker    Social History Main Topics  . Smoking status: Never Smoker   . Smokeless tobacco: Not on file  . Alcohol Use: Yes  . Drug Use:   . Sexually Active: Not Currently   Other Topics Concern  . Not on file   Social History Narrative  . No narrative on file    Current Outpatient Prescriptions on File Prior to Visit  Medication Sig Dispense Refill  . albuterol (PROVENTIL) 90 MCG/ACT inhaler Inhale 2 puffs into the lungs as needed.        . budesonide-formoterol (SYMBICORT) 160-4.5 MCG/ACT inhaler Inhale 2 puffs into the lungs 2 (two) times daily.        Marland Kitchen esomeprazole (NEXIUM) 40 MG capsule Take 40 mg by mouth daily before breakfast.        . Ferrous Sulfate (IRON) 325 (65 FE) MG TABS Take 1 tablet by mouth daily.        Marland Kitchen levothyroxine (SYNTHROID, LEVOTHROID) 100 MCG tablet Take 100 mcg by mouth daily.        . montelukast (SINGULAIR) 10 MG tablet Take 10 mg by mouth at bedtime.        . rosuvastatin (CRESTOR) 10 MG tablet Take 10 mg by mouth daily.        . valsartan (DIOVAN) 160 MG tablet Take 160 mg by mouth daily.        . insulin detemir (LEVEMIR FLEXPEN)  100 UNIT/ML injection Inject 140 Units into the skin every morning. And len needles 1/day  60 mL  12    Allergies  Allergen Reactions  . Sulfonamide Derivatives     REACTION: rash    Family History  Problem Relation Age of Onset  . Diabetes Mother   . Heart disease Mother   . Heart disease Father   . Heart disease Sister   . Diabetes Brother   . Kidney disease Brother     on dialysis   BP 122/80  Pulse 89  Temp 97.9 F (36.6 C) (Oral)  Ht 5\' 6"  (1.676 m)  Wt 249 lb (112.946 kg)  BMI 40.19 kg/m2  SpO2 97%  Review of Systems denies hypoglycemia.      Objective:   Physical Exam VITAL SIGNS:  See vs page GENERAL: no distress Pulses: dorsalis pedis intact bilat.   Feet: no deformity.  no ulcer on the feet.  feet are of normal color and temp.  Trace bilat leg edema Neuro: sensation is intact to touch on the feet.    Lab  Results  Component Value Date   HGBA1C 12.9* 08/27/2010      Assessment & Plan:  DM.  She may need a simpler regimen still

## 2011-09-27 NOTE — Patient Instructions (Addendum)
good diet and exercise habits significanly improve the control of your diabetes.  please let me know if you wish to be referred to a dietician.  high blood sugar is very risky to your health.  you should see an eye doctor every year.  You are at higher than average risk for pneumonia and hepatitis-B.  You should be vaccinated against both.   controlling your blood pressure and cholesterol drastically reduces the damage diabetes does to your body.  this also applies to quitting smoking.  please discuss these with your doctor.  you should take an aspirin every day, unless you have been advised by a doctor not to. check your blood sugar twice a day.  vary the time of day when you check, between before the 3 meals, and at bedtime.  also check if you have symptoms of your blood sugar being too high or too low.  please keep a record of the readings and bring it to your next appointment here.  please call us sooner if your blood sugar goes below 70, or if it stays over 200. blood tests are being requested for you today.  You will receive a letter with results. Increase the insulin to levemir, 140 units each morning only. Please come back for a follow-up appointment in 2 weeks.

## 2011-10-11 ENCOUNTER — Ambulatory Visit (INDEPENDENT_AMBULATORY_CARE_PROVIDER_SITE_OTHER): Admitting: Endocrinology

## 2011-10-11 ENCOUNTER — Encounter: Payer: Self-pay | Admitting: Endocrinology

## 2011-10-11 VITALS — BP 130/84 | HR 82 | Temp 97.6°F | Resp 16 | Wt 248.4 lb

## 2011-10-11 DIAGNOSIS — E109 Type 1 diabetes mellitus without complications: Secondary | ICD-10-CM

## 2011-10-11 NOTE — Patient Instructions (Addendum)
check your blood sugar twice a day.  vary the time of day when you check, between before the 3 meals, and at bedtime.  also check if you have symptoms of your blood sugar being too high or too low.  please keep a record of the readings and bring it to your next appointment here.  please call us sooner if your blood sugar goes below 70, or if it stays over 200.   reduce the levemir to 110 units each morning only.   Please come back for a follow-up appointment in 1 month.

## 2011-10-11 NOTE — Progress Notes (Signed)
Subjective:    Patient ID: Carrie Solis, female    DOB: [birth year], 61 y.o.   MRN: [id number]  HPI Pt returns for f/u of insulin-requiring DM (dx'ed 2008; no known complications; she has done better with a simpler qd insulin schedule).  pt states she feels well in general.  She is working towards having gastric bypass surgery.  she brings a record of her cbg's which i have reviewed today.  On 140 units qam, she is having frequent hypoglycemia at lunch.  She was recently started on invokana.   Past Medical History  Diagnosis Date  . GERD 09/17/2009  . HYPOTHYROIDISM 09/17/2009  . IDDM 09/17/2009  . ANXIETY DISORDER 07/17/2009  . Essential hypertension, benign 07/17/2009  . Asthma     No past surgical history on file.  History   Social History  . Marital Status: Married    Spouse Name: N/A    Number of Children: N/A  . Years of Education: N/A   Occupational History  . Social Worker    Social History Main Topics  . Smoking status: Never Smoker   . Smokeless tobacco: Not on file  . Alcohol Use: Yes  . Drug Use:   . Sexually Active: Not Currently   Other Topics Concern  . Not on file   Social History Narrative  . No narrative on file    Current Outpatient Prescriptions on File Prior to Visit  Medication Sig Dispense Refill  . albuterol (PROVENTIL) 90 MCG/ACT inhaler Inhale 2 puffs into the lungs as needed.        . budesonide-formoterol (SYMBICORT) 160-4.5 MCG/ACT inhaler Inhale 2 puffs into the lungs 2 (two) times daily.        Marland Kitchen esomeprazole (NEXIUM) 40 MG capsule Take 40 mg by mouth daily before breakfast.        . Ferrous Sulfate (IRON) 325 (65 FE) MG TABS Take 1 tablet by mouth daily.        Marland Kitchen glucose blood (FREESTYLE LITE) test strip Two times a day dx 250.01  180 each  3  . insulin detemir (LEVEMIR) 100 UNIT/ML injection Inject 110 Units into the skin every morning. And pen needles 1/day      . levothyroxine (SYNTHROID, LEVOTHROID) 100 MCG tablet Take 100 mcg by  mouth daily.        . montelukast (SINGULAIR) 10 MG tablet Take 10 mg by mouth at bedtime.        . rosuvastatin (CRESTOR) 10 MG tablet Take 10 mg by mouth daily.        . valsartan (DIOVAN) 160 MG tablet Take 160 mg by mouth daily.          Allergies  Allergen Reactions  . Sulfonamide Derivatives     REACTION: rash    Family History  Problem Relation Age of Onset  . Diabetes Mother   . Heart disease Mother   . Heart disease Father   . Heart disease Sister   . Diabetes Brother   . Kidney disease Brother     on dialysis    BP 130/84  Pulse 82  Temp 97.6 F (36.4 C) (Oral)  Resp 16  Wt 248 lb 6 oz (112.662 kg)   Review of Systems Denies LOC    Objective:   Physical Exam VITAL SIGNS:  See vs page GENERAL: no distress PSYCH: Alert and oriented x 3.  Does not appear anxious nor depressed.     Assessment & Plan:  DM is overcontrolled

## 2011-11-15 ENCOUNTER — Ambulatory Visit: Admitting: Endocrinology

## 2011-11-22 ENCOUNTER — Ambulatory Visit: Admitting: Endocrinology

## 2011-11-22 DIAGNOSIS — Z0289 Encounter for other administrative examinations: Secondary | ICD-10-CM

## 2014-09-24 DIAGNOSIS — E039 Hypothyroidism, unspecified: Secondary | ICD-10-CM | POA: Diagnosis not present

## 2014-09-24 DIAGNOSIS — I10 Essential (primary) hypertension: Secondary | ICD-10-CM | POA: Diagnosis not present

## 2014-09-24 DIAGNOSIS — E119 Type 2 diabetes mellitus without complications: Secondary | ICD-10-CM | POA: Diagnosis not present

## 2014-09-24 DIAGNOSIS — E0800 Diabetes mellitus due to underlying condition with hyperosmolarity without nonketotic hyperglycemic-hyperosmolar coma (NKHHC): Secondary | ICD-10-CM | POA: Diagnosis not present

## 2014-11-20 DIAGNOSIS — E089 Diabetes mellitus due to underlying condition without complications: Secondary | ICD-10-CM | POA: Diagnosis not present

## 2014-11-20 DIAGNOSIS — J0100 Acute maxillary sinusitis, unspecified: Secondary | ICD-10-CM | POA: Diagnosis not present

## 2014-11-20 DIAGNOSIS — E6609 Other obesity due to excess calories: Secondary | ICD-10-CM | POA: Diagnosis not present

## 2014-11-20 DIAGNOSIS — I10 Essential (primary) hypertension: Secondary | ICD-10-CM | POA: Diagnosis not present

## 2015-04-30 DIAGNOSIS — E119 Type 2 diabetes mellitus without complications: Secondary | ICD-10-CM | POA: Diagnosis not present

## 2015-04-30 DIAGNOSIS — E6609 Other obesity due to excess calories: Secondary | ICD-10-CM | POA: Diagnosis not present

## 2015-04-30 DIAGNOSIS — E039 Hypothyroidism, unspecified: Secondary | ICD-10-CM | POA: Diagnosis not present

## 2015-05-05 DIAGNOSIS — E039 Hypothyroidism, unspecified: Secondary | ICD-10-CM | POA: Diagnosis not present

## 2015-05-05 DIAGNOSIS — J399 Disease of upper respiratory tract, unspecified: Secondary | ICD-10-CM | POA: Diagnosis not present

## 2015-05-05 DIAGNOSIS — E119 Type 2 diabetes mellitus without complications: Secondary | ICD-10-CM | POA: Diagnosis not present

## 2015-05-05 DIAGNOSIS — E6609 Other obesity due to excess calories: Secondary | ICD-10-CM | POA: Diagnosis not present

## 2015-05-12 DIAGNOSIS — Z803 Family history of malignant neoplasm of breast: Secondary | ICD-10-CM | POA: Diagnosis not present

## 2015-05-12 DIAGNOSIS — Z1231 Encounter for screening mammogram for malignant neoplasm of breast: Secondary | ICD-10-CM | POA: Diagnosis not present

## 2015-05-14 DIAGNOSIS — H401111 Primary open-angle glaucoma, right eye, mild stage: Secondary | ICD-10-CM | POA: Diagnosis not present

## 2015-05-14 DIAGNOSIS — H2513 Age-related nuclear cataract, bilateral: Secondary | ICD-10-CM | POA: Diagnosis not present

## 2015-07-31 DIAGNOSIS — E0800 Diabetes mellitus due to underlying condition with hyperosmolarity without nonketotic hyperglycemic-hyperosmolar coma (NKHHC): Secondary | ICD-10-CM | POA: Diagnosis not present

## 2015-07-31 DIAGNOSIS — I10 Essential (primary) hypertension: Secondary | ICD-10-CM | POA: Diagnosis not present

## 2015-08-07 DIAGNOSIS — Z6841 Body Mass Index (BMI) 40.0 and over, adult: Secondary | ICD-10-CM | POA: Diagnosis not present

## 2015-08-07 DIAGNOSIS — E6609 Other obesity due to excess calories: Secondary | ICD-10-CM | POA: Diagnosis not present

## 2015-08-07 DIAGNOSIS — E089 Diabetes mellitus due to underlying condition without complications: Secondary | ICD-10-CM | POA: Diagnosis not present

## 2015-08-07 DIAGNOSIS — L0390 Cellulitis, unspecified: Secondary | ICD-10-CM | POA: Diagnosis not present

## 2015-08-13 DIAGNOSIS — B070 Plantar wart: Secondary | ICD-10-CM | POA: Diagnosis not present

## 2015-09-01 ENCOUNTER — Ambulatory Visit (INDEPENDENT_AMBULATORY_CARE_PROVIDER_SITE_OTHER): Payer: Self-pay

## 2015-09-01 ENCOUNTER — Ambulatory Visit (INDEPENDENT_AMBULATORY_CARE_PROVIDER_SITE_OTHER): Payer: Medicare Other | Admitting: Podiatry

## 2015-09-01 ENCOUNTER — Encounter: Payer: Self-pay | Admitting: Podiatry

## 2015-09-01 VITALS — BP 122/79 | HR 105 | Resp 16

## 2015-09-01 DIAGNOSIS — Q828 Other specified congenital malformations of skin: Secondary | ICD-10-CM

## 2015-09-01 DIAGNOSIS — Z0189 Encounter for other specified special examinations: Secondary | ICD-10-CM

## 2015-09-01 DIAGNOSIS — E119 Type 2 diabetes mellitus without complications: Secondary | ICD-10-CM

## 2015-09-01 NOTE — Progress Notes (Signed)
   Subjective:    Patient ID: Carrie Solis, female    DOB: 1950-11-14, 65 y.o.   MRN: [id number]  HPI: She presents today with a chief complaint of plantar forefoot pain to the right foot. She states that it is tender and she has a calloused area for the past several months that is painful to walk on. She's try soaking in Epsom salts water to no avail. She states that she has a history of diabetes that she concerned.    Review of Systems  All other systems reviewed and are negative.      Objective:   Physical Exam: Vital signs are stable she is alert and oriented 3 no apparent distress. Pulses are strongly palpable. Neurologic sensorium is intact. Deep tendon reflexes are intact. Muscle strength was 5 over 5 dorsiflexion plantar flexors and inverters and everters all of the musculature is intact. Orthopedic evaluation and x-rays all joints distal to the ankle for range of motion without crepitation. Cutaneous evaluation of Mr. supple well-hydrated cutis she does have a thick porokeratotic lesion forefoot right. Once debrided does not demonstrate any signs of infection. Radiographs taken today in the office demonstrate no osseous abnormalities.        Assessment & Plan:  Assessment: Diabetes mellitus without complications. Porokeratosis right foot.  Plan: Mechanical debridement of the lesion today follow up with me on an as-needed basis.

## 2015-09-21 DIAGNOSIS — Z6841 Body Mass Index (BMI) 40.0 and over, adult: Secondary | ICD-10-CM | POA: Diagnosis not present

## 2015-09-21 DIAGNOSIS — E119 Type 2 diabetes mellitus without complications: Secondary | ICD-10-CM | POA: Diagnosis not present

## 2015-09-21 DIAGNOSIS — I10 Essential (primary) hypertension: Secondary | ICD-10-CM | POA: Diagnosis not present

## 2015-10-14 DIAGNOSIS — E119 Type 2 diabetes mellitus without complications: Secondary | ICD-10-CM | POA: Diagnosis not present

## 2015-10-14 DIAGNOSIS — E6609 Other obesity due to excess calories: Secondary | ICD-10-CM | POA: Diagnosis not present

## 2015-10-14 DIAGNOSIS — I10 Essential (primary) hypertension: Secondary | ICD-10-CM | POA: Diagnosis not present

## 2015-10-23 ENCOUNTER — Other Ambulatory Visit: Payer: Self-pay | Admitting: Obstetrics and Gynecology

## 2015-10-23 DIAGNOSIS — Z124 Encounter for screening for malignant neoplasm of cervix: Secondary | ICD-10-CM | POA: Diagnosis not present

## 2015-10-23 DIAGNOSIS — N950 Postmenopausal bleeding: Secondary | ICD-10-CM | POA: Diagnosis not present

## 2015-10-23 DIAGNOSIS — B373 Candidiasis of vulva and vagina: Secondary | ICD-10-CM | POA: Diagnosis not present

## 2015-10-23 DIAGNOSIS — Z01419 Encounter for gynecological examination (general) (routine) without abnormal findings: Secondary | ICD-10-CM | POA: Diagnosis not present

## 2015-10-23 DIAGNOSIS — Z01411 Encounter for gynecological examination (general) (routine) with abnormal findings: Secondary | ICD-10-CM | POA: Diagnosis not present

## 2015-10-28 ENCOUNTER — Ambulatory Visit
Admission: RE | Admit: 2015-10-28 | Discharge: 2015-10-28 | Disposition: A | Discharge status: Home / Self Care | Payer: Medicare Other | Source: Ambulatory Visit | Attending: Obstetrics and Gynecology | Admitting: Obstetrics and Gynecology

## 2015-10-28 ENCOUNTER — Ambulatory Visit
Admission: RE | Admit: 2015-10-28 | Discharge: 2015-10-28 | Disposition: A | Discharge status: Home / Self Care | Source: Ambulatory Visit | Attending: Obstetrics and Gynecology | Admitting: Obstetrics and Gynecology

## 2015-10-28 DIAGNOSIS — N950 Postmenopausal bleeding: Secondary | ICD-10-CM

## 2015-10-29 DIAGNOSIS — N950 Postmenopausal bleeding: Secondary | ICD-10-CM | POA: Diagnosis not present

## 2016-03-08 DIAGNOSIS — Z23 Encounter for immunization: Secondary | ICD-10-CM | POA: Diagnosis not present

## 2016-03-08 DIAGNOSIS — I10 Essential (primary) hypertension: Secondary | ICD-10-CM | POA: Diagnosis not present

## 2016-03-08 DIAGNOSIS — E785 Hyperlipidemia, unspecified: Secondary | ICD-10-CM | POA: Diagnosis not present

## 2016-03-10 DIAGNOSIS — G4733 Obstructive sleep apnea (adult) (pediatric): Secondary | ICD-10-CM | POA: Diagnosis not present

## 2016-05-12 DIAGNOSIS — Z1231 Encounter for screening mammogram for malignant neoplasm of breast: Secondary | ICD-10-CM | POA: Diagnosis not present

## 2016-05-12 DIAGNOSIS — Z803 Family history of malignant neoplasm of breast: Secondary | ICD-10-CM | POA: Diagnosis not present

## 2016-11-14 DIAGNOSIS — R609 Edema, unspecified: Secondary | ICD-10-CM | POA: Diagnosis not present

## 2016-11-14 DIAGNOSIS — I10 Essential (primary) hypertension: Secondary | ICD-10-CM | POA: Diagnosis not present

## 2016-11-14 DIAGNOSIS — Z23 Encounter for immunization: Secondary | ICD-10-CM | POA: Diagnosis not present

## 2016-11-14 DIAGNOSIS — Z0000 Encounter for general adult medical examination without abnormal findings: Secondary | ICD-10-CM | POA: Diagnosis not present

## 2016-11-14 DIAGNOSIS — E039 Hypothyroidism, unspecified: Secondary | ICD-10-CM | POA: Diagnosis not present

## 2016-11-14 DIAGNOSIS — E119 Type 2 diabetes mellitus without complications: Secondary | ICD-10-CM | POA: Diagnosis not present

## 2016-11-14 DIAGNOSIS — E118 Type 2 diabetes mellitus with unspecified complications: Secondary | ICD-10-CM | POA: Diagnosis not present

## 2017-02-13 DIAGNOSIS — E118 Type 2 diabetes mellitus with unspecified complications: Secondary | ICD-10-CM | POA: Diagnosis not present

## 2017-02-13 DIAGNOSIS — I10 Essential (primary) hypertension: Secondary | ICD-10-CM | POA: Diagnosis not present

## 2017-02-27 DIAGNOSIS — E6609 Other obesity due to excess calories: Secondary | ICD-10-CM | POA: Diagnosis not present

## 2017-02-27 DIAGNOSIS — E118 Type 2 diabetes mellitus with unspecified complications: Secondary | ICD-10-CM | POA: Diagnosis not present

## 2017-02-27 DIAGNOSIS — I10 Essential (primary) hypertension: Secondary | ICD-10-CM | POA: Diagnosis not present

## 2017-03-30 DIAGNOSIS — H2513 Age-related nuclear cataract, bilateral: Secondary | ICD-10-CM | POA: Diagnosis not present

## 2017-03-30 DIAGNOSIS — H401134 Primary open-angle glaucoma, bilateral, indeterminate stage: Secondary | ICD-10-CM | POA: Diagnosis not present

## 2017-03-30 DIAGNOSIS — E119 Type 2 diabetes mellitus without complications: Secondary | ICD-10-CM | POA: Diagnosis not present

## 2017-04-11 DIAGNOSIS — E039 Hypothyroidism, unspecified: Secondary | ICD-10-CM | POA: Diagnosis not present

## 2017-04-11 DIAGNOSIS — E119 Type 2 diabetes mellitus without complications: Secondary | ICD-10-CM | POA: Diagnosis not present

## 2017-04-11 DIAGNOSIS — E785 Hyperlipidemia, unspecified: Secondary | ICD-10-CM | POA: Diagnosis not present

## 2017-04-11 DIAGNOSIS — I10 Essential (primary) hypertension: Secondary | ICD-10-CM | POA: Diagnosis not present

## 2017-04-20 DIAGNOSIS — H2513 Age-related nuclear cataract, bilateral: Secondary | ICD-10-CM | POA: Diagnosis not present

## 2017-04-20 DIAGNOSIS — H401134 Primary open-angle glaucoma, bilateral, indeterminate stage: Secondary | ICD-10-CM | POA: Diagnosis not present

## 2017-05-11 DIAGNOSIS — I10 Essential (primary) hypertension: Secondary | ICD-10-CM | POA: Diagnosis not present

## 2017-05-11 DIAGNOSIS — E118 Type 2 diabetes mellitus with unspecified complications: Secondary | ICD-10-CM | POA: Diagnosis not present

## 2017-05-11 DIAGNOSIS — E1165 Type 2 diabetes mellitus with hyperglycemia: Secondary | ICD-10-CM | POA: Diagnosis not present

## 2017-05-15 DIAGNOSIS — Z1231 Encounter for screening mammogram for malignant neoplasm of breast: Secondary | ICD-10-CM | POA: Diagnosis not present

## 2017-05-18 DIAGNOSIS — I10 Essential (primary) hypertension: Secondary | ICD-10-CM | POA: Diagnosis not present

## 2017-05-18 DIAGNOSIS — E118 Type 2 diabetes mellitus with unspecified complications: Secondary | ICD-10-CM | POA: Diagnosis not present

## 2017-05-18 DIAGNOSIS — E7800 Pure hypercholesterolemia, unspecified: Secondary | ICD-10-CM | POA: Diagnosis not present

## 2017-05-18 DIAGNOSIS — E6609 Other obesity due to excess calories: Secondary | ICD-10-CM | POA: Diagnosis not present

## 2017-05-18 DIAGNOSIS — E039 Hypothyroidism, unspecified: Secondary | ICD-10-CM | POA: Diagnosis not present

## 2017-05-18 DIAGNOSIS — E089 Diabetes mellitus due to underlying condition without complications: Secondary | ICD-10-CM | POA: Diagnosis not present

## 2017-05-18 DIAGNOSIS — E119 Type 2 diabetes mellitus without complications: Secondary | ICD-10-CM | POA: Diagnosis not present

## 2017-08-04 DIAGNOSIS — Z01419 Encounter for gynecological examination (general) (routine) without abnormal findings: Secondary | ICD-10-CM | POA: Diagnosis not present

## 2017-08-22 DIAGNOSIS — R9389 Abnormal findings on diagnostic imaging of other specified body structures: Secondary | ICD-10-CM | POA: Diagnosis not present

## 2017-08-22 DIAGNOSIS — N950 Postmenopausal bleeding: Secondary | ICD-10-CM | POA: Diagnosis not present

## 2017-09-20 DIAGNOSIS — R9389 Abnormal findings on diagnostic imaging of other specified body structures: Secondary | ICD-10-CM | POA: Diagnosis not present

## 2017-09-20 DIAGNOSIS — N950 Postmenopausal bleeding: Secondary | ICD-10-CM | POA: Diagnosis not present

## 2017-11-15 DIAGNOSIS — Z6841 Body Mass Index (BMI) 40.0 and over, adult: Secondary | ICD-10-CM | POA: Diagnosis not present

## 2017-11-15 DIAGNOSIS — J0100 Acute maxillary sinusitis, unspecified: Secondary | ICD-10-CM | POA: Diagnosis not present

## 2017-11-15 DIAGNOSIS — E119 Type 2 diabetes mellitus without complications: Secondary | ICD-10-CM | POA: Diagnosis not present

## 2017-11-15 DIAGNOSIS — I10 Essential (primary) hypertension: Secondary | ICD-10-CM | POA: Diagnosis not present

## 2017-11-29 DIAGNOSIS — Z6841 Body Mass Index (BMI) 40.0 and over, adult: Secondary | ICD-10-CM | POA: Diagnosis not present

## 2017-11-29 DIAGNOSIS — I10 Essential (primary) hypertension: Secondary | ICD-10-CM | POA: Diagnosis not present

## 2018-01-01 DIAGNOSIS — I10 Essential (primary) hypertension: Secondary | ICD-10-CM | POA: Diagnosis not present

## 2018-01-01 DIAGNOSIS — Z0000 Encounter for general adult medical examination without abnormal findings: Secondary | ICD-10-CM | POA: Diagnosis not present

## 2018-01-01 DIAGNOSIS — Z6841 Body Mass Index (BMI) 40.0 and over, adult: Secondary | ICD-10-CM | POA: Diagnosis not present

## 2018-01-01 DIAGNOSIS — E1165 Type 2 diabetes mellitus with hyperglycemia: Secondary | ICD-10-CM | POA: Diagnosis not present

## 2018-02-05 DIAGNOSIS — H6010 Cellulitis of external ear, unspecified ear: Secondary | ICD-10-CM | POA: Diagnosis not present

## 2018-02-05 DIAGNOSIS — Z6841 Body Mass Index (BMI) 40.0 and over, adult: Secondary | ICD-10-CM | POA: Diagnosis not present

## 2018-02-05 DIAGNOSIS — E1165 Type 2 diabetes mellitus with hyperglycemia: Secondary | ICD-10-CM | POA: Diagnosis not present

## 2018-02-19 DIAGNOSIS — I10 Essential (primary) hypertension: Secondary | ICD-10-CM | POA: Diagnosis not present

## 2018-02-19 DIAGNOSIS — H6691 Otitis media, unspecified, right ear: Secondary | ICD-10-CM | POA: Diagnosis not present

## 2018-02-19 DIAGNOSIS — E039 Hypothyroidism, unspecified: Secondary | ICD-10-CM | POA: Diagnosis not present

## 2018-02-19 DIAGNOSIS — E118 Type 2 diabetes mellitus with unspecified complications: Secondary | ICD-10-CM | POA: Diagnosis not present

## 2018-03-13 DIAGNOSIS — I10 Essential (primary) hypertension: Secondary | ICD-10-CM | POA: Diagnosis not present

## 2018-03-13 DIAGNOSIS — E6609 Other obesity due to excess calories: Secondary | ICD-10-CM | POA: Diagnosis not present

## 2018-03-13 DIAGNOSIS — E1165 Type 2 diabetes mellitus with hyperglycemia: Secondary | ICD-10-CM | POA: Diagnosis not present

## 2018-03-13 DIAGNOSIS — E118 Type 2 diabetes mellitus with unspecified complications: Secondary | ICD-10-CM | POA: Diagnosis not present

## 2018-03-24 DIAGNOSIS — J393 Upper respiratory tract hypersensitivity reaction, site unspecified: Secondary | ICD-10-CM | POA: Diagnosis not present

## 2018-04-11 DIAGNOSIS — J069 Acute upper respiratory infection, unspecified: Secondary | ICD-10-CM | POA: Diagnosis not present

## 2018-04-11 DIAGNOSIS — E1169 Type 2 diabetes mellitus with other specified complication: Secondary | ICD-10-CM | POA: Diagnosis not present

## 2018-05-23 DIAGNOSIS — I10 Essential (primary) hypertension: Secondary | ICD-10-CM | POA: Diagnosis not present

## 2018-05-23 DIAGNOSIS — E1169 Type 2 diabetes mellitus with other specified complication: Secondary | ICD-10-CM | POA: Diagnosis not present

## 2018-06-13 DIAGNOSIS — E1169 Type 2 diabetes mellitus with other specified complication: Secondary | ICD-10-CM | POA: Diagnosis not present

## 2018-06-13 DIAGNOSIS — I10 Essential (primary) hypertension: Secondary | ICD-10-CM | POA: Diagnosis not present

## 2018-10-10 DIAGNOSIS — I10 Essential (primary) hypertension: Secondary | ICD-10-CM | POA: Diagnosis not present

## 2018-10-10 DIAGNOSIS — E1169 Type 2 diabetes mellitus with other specified complication: Secondary | ICD-10-CM | POA: Diagnosis not present

## 2018-11-02 ENCOUNTER — Other Ambulatory Visit: Payer: Self-pay | Admitting: Family Medicine

## 2018-11-09 DIAGNOSIS — Z1231 Encounter for screening mammogram for malignant neoplasm of breast: Secondary | ICD-10-CM | POA: Diagnosis not present

## 2018-11-09 DIAGNOSIS — Z803 Family history of malignant neoplasm of breast: Secondary | ICD-10-CM | POA: Diagnosis not present

## 2018-11-21 DIAGNOSIS — E669 Obesity, unspecified: Secondary | ICD-10-CM | POA: Diagnosis not present

## 2018-11-21 DIAGNOSIS — E1169 Type 2 diabetes mellitus with other specified complication: Secondary | ICD-10-CM | POA: Diagnosis not present

## 2018-11-21 DIAGNOSIS — I10 Essential (primary) hypertension: Secondary | ICD-10-CM | POA: Diagnosis not present

## 2018-11-21 DIAGNOSIS — M130 Polyarthritis, unspecified: Secondary | ICD-10-CM | POA: Diagnosis not present

## 2018-12-12 DIAGNOSIS — E119 Type 2 diabetes mellitus without complications: Secondary | ICD-10-CM | POA: Diagnosis not present

## 2018-12-12 DIAGNOSIS — H401134 Primary open-angle glaucoma, bilateral, indeterminate stage: Secondary | ICD-10-CM | POA: Diagnosis not present

## 2018-12-12 DIAGNOSIS — H524 Presbyopia: Secondary | ICD-10-CM | POA: Diagnosis not present

## 2018-12-12 DIAGNOSIS — H2513 Age-related nuclear cataract, bilateral: Secondary | ICD-10-CM | POA: Diagnosis not present

## 2018-12-27 DIAGNOSIS — Z01419 Encounter for gynecological examination (general) (routine) without abnormal findings: Secondary | ICD-10-CM | POA: Diagnosis not present

## 2019-01-14 DIAGNOSIS — Z780 Asymptomatic menopausal state: Secondary | ICD-10-CM | POA: Diagnosis not present

## 2019-01-14 DIAGNOSIS — J45909 Unspecified asthma, uncomplicated: Secondary | ICD-10-CM | POA: Diagnosis not present

## 2019-01-14 DIAGNOSIS — E119 Type 2 diabetes mellitus without complications: Secondary | ICD-10-CM | POA: Diagnosis not present

## 2019-01-14 DIAGNOSIS — K219 Gastro-esophageal reflux disease without esophagitis: Secondary | ICD-10-CM | POA: Diagnosis not present

## 2019-01-14 DIAGNOSIS — E2839 Other primary ovarian failure: Secondary | ICD-10-CM | POA: Diagnosis not present

## 2019-02-09 ENCOUNTER — Inpatient Hospital Stay (HOSPITAL_COMMUNITY)
Admission: EM | Admit: 2019-02-09 | Discharge: 2019-02-14 | DRG: 177 | Disposition: A | Discharge status: Home Health | Payer: Medicare PPO | Attending: Internal Medicine | Admitting: Internal Medicine

## 2019-02-09 ENCOUNTER — Emergency Department (HOSPITAL_COMMUNITY): Payer: Medicare PPO

## 2019-02-09 ENCOUNTER — Other Ambulatory Visit: Payer: Self-pay

## 2019-02-09 ENCOUNTER — Encounter (HOSPITAL_COMMUNITY): Payer: Self-pay

## 2019-02-09 DIAGNOSIS — E785 Hyperlipidemia, unspecified: Secondary | ICD-10-CM | POA: Diagnosis present

## 2019-02-09 DIAGNOSIS — J9601 Acute respiratory failure with hypoxia: Secondary | ICD-10-CM | POA: Diagnosis present

## 2019-02-09 DIAGNOSIS — Z833 Family history of diabetes mellitus: Secondary | ICD-10-CM | POA: Diagnosis not present

## 2019-02-09 DIAGNOSIS — F419 Anxiety disorder, unspecified: Secondary | ICD-10-CM | POA: Diagnosis present

## 2019-02-09 DIAGNOSIS — Z8249 Family history of ischemic heart disease and other diseases of the circulatory system: Secondary | ICD-10-CM

## 2019-02-09 DIAGNOSIS — I129 Hypertensive chronic kidney disease with stage 1 through stage 4 chronic kidney disease, or unspecified chronic kidney disease: Secondary | ICD-10-CM | POA: Diagnosis present

## 2019-02-09 DIAGNOSIS — Z6837 Body mass index (BMI) 37.0-37.9, adult: Secondary | ICD-10-CM

## 2019-02-09 DIAGNOSIS — G4733 Obstructive sleep apnea (adult) (pediatric): Secondary | ICD-10-CM | POA: Diagnosis present

## 2019-02-09 DIAGNOSIS — J45909 Unspecified asthma, uncomplicated: Secondary | ICD-10-CM | POA: Diagnosis present

## 2019-02-09 DIAGNOSIS — E669 Obesity, unspecified: Secondary | ICD-10-CM

## 2019-02-09 DIAGNOSIS — R531 Weakness: Secondary | ICD-10-CM | POA: Diagnosis present

## 2019-02-09 DIAGNOSIS — R05 Cough: Secondary | ICD-10-CM | POA: Diagnosis not present

## 2019-02-09 DIAGNOSIS — E1169 Type 2 diabetes mellitus with other specified complication: Secondary | ICD-10-CM | POA: Diagnosis not present

## 2019-02-09 DIAGNOSIS — Z882 Allergy status to sulfonamides status: Secondary | ICD-10-CM

## 2019-02-09 DIAGNOSIS — Z7951 Long term (current) use of inhaled steroids: Secondary | ICD-10-CM | POA: Diagnosis not present

## 2019-02-09 DIAGNOSIS — Z209 Contact with and (suspected) exposure to unspecified communicable disease: Secondary | ICD-10-CM | POA: Diagnosis not present

## 2019-02-09 DIAGNOSIS — Z794 Long term (current) use of insulin: Secondary | ICD-10-CM

## 2019-02-09 DIAGNOSIS — Z841 Family history of disorders of kidney and ureter: Secondary | ICD-10-CM

## 2019-02-09 DIAGNOSIS — N179 Acute kidney failure, unspecified: Secondary | ICD-10-CM | POA: Diagnosis present

## 2019-02-09 DIAGNOSIS — N183 Chronic kidney disease, stage 3 unspecified: Secondary | ICD-10-CM | POA: Diagnosis present

## 2019-02-09 DIAGNOSIS — E039 Hypothyroidism, unspecified: Secondary | ICD-10-CM | POA: Diagnosis present

## 2019-02-09 DIAGNOSIS — Z79899 Other long term (current) drug therapy: Secondary | ICD-10-CM

## 2019-02-09 DIAGNOSIS — U071 COVID-19: Principal | ICD-10-CM

## 2019-02-09 DIAGNOSIS — K219 Gastro-esophageal reflux disease without esophagitis: Secondary | ICD-10-CM | POA: Diagnosis present

## 2019-02-09 DIAGNOSIS — J1282 Pneumonia due to coronavirus disease 2019: Secondary | ICD-10-CM | POA: Diagnosis present

## 2019-02-09 DIAGNOSIS — E1122 Type 2 diabetes mellitus with diabetic chronic kidney disease: Secondary | ICD-10-CM | POA: Diagnosis present

## 2019-02-09 DIAGNOSIS — Z79890 Hormone replacement therapy (postmenopausal): Secondary | ICD-10-CM | POA: Diagnosis not present

## 2019-02-09 DIAGNOSIS — R000 Tachycardia, unspecified: Secondary | ICD-10-CM | POA: Diagnosis not present

## 2019-02-09 DIAGNOSIS — I10 Essential (primary) hypertension: Secondary | ICD-10-CM | POA: Diagnosis present

## 2019-02-09 LAB — COMPREHENSIVE METABOLIC PANEL
ALT: 20 U/L (ref 0–44)
AST: 34 U/L (ref 15–41)
Albumin: 3.1 g/dL — ABNORMAL LOW (ref 3.5–5.0)
Alkaline Phosphatase: 67 U/L (ref 38–126)
Anion gap: 13 (ref 5–15)
BUN: 29 mg/dL — ABNORMAL HIGH (ref 8–23)
CO2: 28 mmol/L (ref 22–32)
Calcium: 8.6 mg/dL — ABNORMAL LOW (ref 8.9–10.3)
Chloride: 100 mmol/L (ref 98–111)
Creatinine, Ser: 1.51 mg/dL — ABNORMAL HIGH (ref 0.44–1.00)
GFR calc Af Amer: 40 mL/min — ABNORMAL LOW (ref >60)
GFR calc non Af Amer: 35 mL/min — ABNORMAL LOW (ref >60)
Glucose, Bld: 233 mg/dL — ABNORMAL HIGH (ref 70–99)
Potassium: 3.9 mmol/L (ref 3.5–5.1)
Sodium: 141 mmol/L (ref 135–145)
Total Bilirubin: 0.7 mg/dL (ref 0.3–1.2)
Total Protein: 7.5 g/dL (ref 6.5–8.1)

## 2019-02-09 LAB — CBC WITH DIFFERENTIAL/PLATELET
Abs Immature Granulocytes: 0.03 10*3/uL (ref 0.00–0.07)
Basophils Absolute: 0 10*3/uL (ref 0.0–0.1)
Basophils Relative: 0 %
Eosinophils Absolute: 0 10*3/uL (ref 0.0–0.5)
Eosinophils Relative: 0 %
HCT: 52.5 % — ABNORMAL HIGH (ref 36.0–46.0)
Hemoglobin: 16.4 g/dL — ABNORMAL HIGH (ref 12.0–15.0)
Immature Granulocytes: 0 %
Lymphocytes Relative: 13 %
Lymphs Abs: 1 10*3/uL (ref 0.7–4.0)
MCH: 28.5 pg (ref 26.0–34.0)
MCHC: 31.2 g/dL (ref 30.0–36.0)
MCV: 91.1 fL (ref 80.0–100.0)
Monocytes Absolute: 0.4 10*3/uL (ref 0.1–1.0)
Monocytes Relative: 5 %
Neutro Abs: 6.5 10*3/uL (ref 1.7–7.7)
Neutrophils Relative %: 82 %
Platelets: 217 10*3/uL (ref 150–400)
RBC: 5.76 MIL/uL — ABNORMAL HIGH (ref 3.87–5.11)
RDW: 14 % (ref 11.5–15.5)
WBC: 7.9 10*3/uL (ref 4.0–10.5)
nRBC: 0 % (ref 0.0–0.2)

## 2019-02-09 LAB — POC SARS CORONAVIRUS 2 AG -  ED: SARS Coronavirus 2 Ag: POSITIVE — AB

## 2019-02-09 MED ORDER — ACETAMINOPHEN 325 MG PO TABS
650.0000 mg | ORAL_TABLET | Freq: Once | ORAL | Status: AC
  Administered 2019-02-09: 21:00:00 650 mg via ORAL
  Filled 2019-02-09: qty 2

## 2019-02-09 MED ORDER — SODIUM CHLORIDE 0.9 % IV BOLUS
500.0000 mL | Freq: Once | INTRAVENOUS | Status: AC
  Administered 2019-02-09: 23:00:00 500 mL via INTRAVENOUS

## 2019-02-09 MED ORDER — SODIUM CHLORIDE 0.9 % IV BOLUS: 250.0000 mL | Freq: Once | INTRAVENOUS | Status: DC

## 2019-02-09 NOTE — Discharge Instructions (Addendum)
Please take Tylenol (acetaminophen) to relieve your pain.  You may take tylenol, up to 1,000 mg (two extra strength pills).  Do not take more than 3,000 mg tylenol in a 24 hour period.  Please check all medication labels as many medications such as pain and cold medications may contain tylenol. Please do not drink alcohol while taking this medication.  ° °

## 2019-02-09 NOTE — ED Triage Notes (Addendum)
Per EMS, pt from home c/o of weakness, chills, and body aches starting a few days ago. Patient's daughter tested positive for covid a few days ago. Patient usually ambulatory and currently a&ox4. Hx of htn and diabetes.

## 2019-02-09 NOTE — ED Provider Notes (Signed)
[facility] DEPT Provider Note   CSN: [id number] Arrival date & time: 02/09/19  1913     History Chief Complaint  Patient presents with  . Weakness    ACE HOCUTT is a 69 y.o. female with a past medical history of DM 2, hypertension, GERD, hyperlipidemia, morbid obesity, OSA, who presents today for evaluation of generalized body weakness, body aches and chills.  Patient's daughter tested for Covid a few days ago.  She states that she developed symptoms about 1 week ago.  Her primary symptoms are the body aches, generalized weakness and fatigue and chills.  She denies any significant chest pains or shortness of breath.  She reports nausea and having 1 episode of vomiting.  No significant diarrhea or abdominal pain.  She states that overall her appetite has been poor and she has had difficulty with adequate p.o. intake.    She states that she last had any antipyretics yesterday when she took ibuprofen.  HPI     Past Medical History:  Diagnosis Date  . ANXIETY DISORDER 07/17/2009  . Asthma   . Essential hypertension, benign 07/17/2009  . GERD 09/17/2009  . HYPOTHYROIDISM 09/17/2009  . IDDM 09/17/2009    Patient Active Problem List   Diagnosis Date Noted  . Diabetes mellitus type 2 in obese ([place]) 08/25/2011  . Hyperlipidemia 08/25/2011  . Hypertension, benign essential, goal below 140/90 08/25/2011  . Morbid obesity ([place]) 08/25/2011  . Obstructive sleep apnea on CPAP 08/25/2011  . HYPOTHYROIDISM 09/17/2009  . IDDM 09/17/2009  . GERD 09/17/2009  . ANXIETY DISORDER 07/17/2009  . ESSENTIAL HYPERTENSION, BENIGN 07/17/2009    History reviewed. No pertinent surgical history.   OB History   No obstetric history on file.     Family History  Problem Relation Age of Onset  . Diabetes Mother   . Heart disease Mother   . Heart disease Father   . Heart disease Sister   . Diabetes Brother   . Kidney disease Brother        on dialysis    Social  History   Tobacco Use  . Smoking status: Never Smoker  . Smokeless tobacco: Never Used  Substance Use Topics  . Alcohol use: Yes  . Drug use: Not on file    Home Medications Prior to Admission medications   Medication Sig Start Date End Date Taking? Authorizing Provider  albuterol (PROVENTIL) 90 MCG/ACT inhaler Inhale 2 puffs into the lungs as needed.      [provider]  budesonide-formoterol (SYMBICORT) 160-4.5 MCG/ACT inhaler Inhale 2 puffs into the lungs 2 (two) times daily.      [provider]  esomeprazole (NEXIUM) 40 MG capsule Take 40 mg by mouth daily before breakfast.      [provider]  glucose blood (FREESTYLE LITE) test strip Two times a day dx 250.01 09/27/11   Renato Shin, MD  levothyroxine (SYNTHROID, LEVOTHROID) 100 MCG tablet Take 100 mcg by mouth daily.      [provider]  montelukast (SINGULAIR) 10 MG tablet Take 10 mg by mouth at bedtime.      [provider]  rosuvastatin (CRESTOR) 10 MG tablet Take 10 mg by mouth daily.      [provider]  valsartan (DIOVAN) 160 MG tablet Take 160 mg by mouth daily.      [provider]  valsartan-hydrochlorothiazide (DIOVAN-HCT) 160-25 MG tablet Take by mouth.    [provider]    Allergies  Sulfonamide derivatives  Review of Systems   Review of Systems  Constitutional: Positive for appetite change, chills, fatigue and fever.  Respiratory: Negative for cough, chest tightness and shortness of breath.   Cardiovascular: Negative for chest pain, palpitations and leg swelling.  Gastrointestinal: Positive for nausea and vomiting. Negative for abdominal pain and diarrhea.  Genitourinary: Negative for dysuria, frequency and urgency.  Musculoskeletal: Positive for arthralgias and myalgias.  Skin: Negative for color change, rash and wound.  Neurological: Positive for weakness (Generalized, body wide). Negative for headaches (Previously, not currently.  ).  Psychiatric/Behavioral: Negative for confusion.  All other systems reviewed and are negative.   Physical Exam Updated Vital Signs BP 123/77 (BP Location: Right Arm)   Pulse (!) 108   Temp 98.1 F (36.7 C) (Oral)   Resp 16   Ht 5\' 6"  (1.676 m)   Wt 104.3 kg   SpO2 96%   BMI 37.12 kg/m   Physical Exam Vitals and nursing note reviewed.  Constitutional:      Appearance: She is well-developed. She is obese. She is not diaphoretic.  HENT:     Head: Normocephalic and atraumatic.     Mouth/Throat:     Mouth: Mucous membranes are moist.  Eyes:     General: No scleral icterus.       Right eye: No discharge.        Left eye: No discharge.     Conjunctiva/sclera: Conjunctivae normal.  Cardiovascular:     Rate and Rhythm: Regular rhythm. Tachycardia present.     Pulses: Normal pulses.     Heart sounds: Normal heart sounds.  Pulmonary:     Effort: Pulmonary effort is normal. No respiratory distress.     Breath sounds: Normal breath sounds. No stridor.  Abdominal:     General: Abdomen is flat. Bowel sounds are normal. There is no distension.     Tenderness: There is no abdominal tenderness.  Musculoskeletal:        General: No tenderness or deformity.     Cervical back: Normal range of motion and neck supple. No rigidity.     Right lower leg: No edema.     Left lower leg: No edema.     Comments: Generalized weakness with out any localized or specific neurologic deficits.    Skin:    General: Skin is warm and dry.  Neurological:     General: No focal deficit present.     Mental Status: She is alert and oriented to person, place, and time.     Motor: No abnormal muscle tone.  Psychiatric:        Mood and Affect: Mood normal.        Behavior: Behavior normal.     ED Results / Procedures / Treatments   Labs (all labs ordered are listed, but only abnormal results are displayed) Labs Reviewed  COMPREHENSIVE METABOLIC PANEL - Abnormal; Notable for the following  components:      Result Value   Glucose, Bld 233 (*)    BUN 29 (*)    Creatinine, Ser 1.51 (*)    Calcium 8.6 (*)    Albumin 3.1 (*)    GFR calc non Af Amer 35 (*)    GFR calc Af Amer 40 (*)    All other components within normal limits  CBC WITH DIFFERENTIAL/PLATELET - Abnormal; Notable for the following components:   RBC 5.76 (*)    Hemoglobin 16.4 (*)    HCT 52.5 (*)  All other components within normal limits  POC SARS CORONAVIRUS 2 AG -  ED - Abnormal; Notable for the following components:   SARS Coronavirus 2 Ag POSITIVE (*)    All other components within normal limits    EKG EKG Interpretation  Date/Time:  Saturday February 09 2019 19:28:09 EST Ventricular Rate:  122 PR Interval:    QRS Duration: 119 QT Interval:  310 QTC Calculation: 442 R Axis:   20 Text Interpretation: Sinus tachycardia Nonspecific intraventricular conduction delay Baseline wander Confirmed by Quintella Reichert [phone]) on 02/09/2019 8:16:29 PM   Radiology DG Chest Port 1 View  Result Date: 02/09/2019 CLINICAL DATA:  Cough. EXAM: PORTABLE CHEST 1 VIEW COMPARISON:  September 04, 2003 FINDINGS: There are few subtle scattered airspace opacities bilaterally. There is no pneumothorax or large pleural effusion. The heart size is normal. Aortic calcifications are noted. There are degenerative changes of the left glenohumeral joint. IMPRESSION: Subtle scattered airspace opacities which can be seen in patients with viral pneumonia. Electronically Signed   By: Constance Holster M.D.   On: 02/09/2019 20:26    Procedures Procedures (including critical care time)  Medications Ordered in ED Medications  acetaminophen (TYLENOL) tablet 650 mg (650 mg Oral Given 02/09/19 2128)  sodium chloride 0.9 % bolus 500 mL (500 mLs Intravenous New Bag/Given 02/09/19 2327)    ED Course  I have reviewed the triage vital signs and the nursing notes.  Pertinent labs & imaging results that were available during my care of the patient  were reviewed by me and considered in my medical decision making (see chart for details).  Clinical Course as of Feb 10 143  Sat Feb 09, 2019  2045 SARS Coronavirus 2 Ag(!): POSITIVE [EH]    Clinical Course User Index [EH] Lorin Glass, PA-C   MDM Rules/Calculators/A&P                     Patient presents today for evaluation of approximately 1 week of generalized fatigue, generalized weakness, myalgias, arthralgias.  On arrival she was found to be febrile at 103.2 and tachycardic at 118.  She is at 97% on room air.  She denies any significant cough or shortness of breath.  No chest pain.  She reports generally poor p.o. intake however states compliance with her medications.  Tylenol ordered.  Given her poor p.o. intake along with her comorbidity of diabetes will check basic labs to assure she does not have any electrolyte imbalances contributing to her tachycardia.  Covid is positive. Labs obtained and showed concern for dehydration with hemoglobin elevated at 16.  CMP shows creatinine elevated at 1.15.  Her glucose is high at 233.  Given her tachycardia, chest x-ray was obtained showing scattered airspace opacities consistent with viral pneumonia.  Patient was given IV fluids.  She had generalized weakness and difficulty walking requiring admission.  I spoke with Dr. Alcario Drought who agreed to see the patient for admission.  This patient was seen as a shared visit with Dr. Dina Rich.  JAHMAYA SIEMER was evaluated in Emergency Department on 02/10/2019 for the symptoms described in the history of present illness. She was evaluated in the context of the global COVID-19 pandemic, which necessitated consideration that the patient might be at risk for infection with the SARS-CoV-2 virus that causes COVID-19. Institutional protocols and algorithms that pertain to the evaluation of patients at risk for COVID-19 are in a state of rapid change based on information released by regulatory bodies  including the CDC and federal and state organizations. These policies and algorithms were followed during the patient's care in the ED.   Final Clinical Impression(s) / ED Diagnoses Final diagnoses:  COVID-19  Generalized weakness    Rx / DC Orders ED Discharge Orders    None       Lorin Glass, PA-C 02/10/19 0148    Merryl Hacker, MD 02/10/19 [phone]

## 2019-02-10 DIAGNOSIS — E039 Hypothyroidism, unspecified: Secondary | ICD-10-CM

## 2019-02-10 DIAGNOSIS — E1169 Type 2 diabetes mellitus with other specified complication: Secondary | ICD-10-CM

## 2019-02-10 DIAGNOSIS — I10 Essential (primary) hypertension: Secondary | ICD-10-CM | POA: Diagnosis not present

## 2019-02-10 DIAGNOSIS — J45909 Unspecified asthma, uncomplicated: Secondary | ICD-10-CM | POA: Diagnosis present

## 2019-02-10 DIAGNOSIS — G4733 Obstructive sleep apnea (adult) (pediatric): Secondary | ICD-10-CM | POA: Diagnosis present

## 2019-02-10 DIAGNOSIS — Z6837 Body mass index (BMI) 37.0-37.9, adult: Secondary | ICD-10-CM | POA: Diagnosis not present

## 2019-02-10 DIAGNOSIS — Z8249 Family history of ischemic heart disease and other diseases of the circulatory system: Secondary | ICD-10-CM | POA: Diagnosis not present

## 2019-02-10 DIAGNOSIS — I129 Hypertensive chronic kidney disease with stage 1 through stage 4 chronic kidney disease, or unspecified chronic kidney disease: Secondary | ICD-10-CM | POA: Diagnosis present

## 2019-02-10 DIAGNOSIS — Z79899 Other long term (current) drug therapy: Secondary | ICD-10-CM | POA: Diagnosis not present

## 2019-02-10 DIAGNOSIS — Z833 Family history of diabetes mellitus: Secondary | ICD-10-CM | POA: Diagnosis not present

## 2019-02-10 DIAGNOSIS — Z882 Allergy status to sulfonamides status: Secondary | ICD-10-CM | POA: Diagnosis not present

## 2019-02-10 DIAGNOSIS — K219 Gastro-esophageal reflux disease without esophagitis: Secondary | ICD-10-CM | POA: Diagnosis present

## 2019-02-10 DIAGNOSIS — N183 Chronic kidney disease, stage 3 unspecified: Secondary | ICD-10-CM | POA: Diagnosis present

## 2019-02-10 DIAGNOSIS — R531 Weakness: Secondary | ICD-10-CM | POA: Diagnosis present

## 2019-02-10 DIAGNOSIS — Z794 Long term (current) use of insulin: Secondary | ICD-10-CM | POA: Diagnosis not present

## 2019-02-10 DIAGNOSIS — E785 Hyperlipidemia, unspecified: Secondary | ICD-10-CM | POA: Diagnosis present

## 2019-02-10 DIAGNOSIS — N179 Acute kidney failure, unspecified: Secondary | ICD-10-CM | POA: Diagnosis present

## 2019-02-10 DIAGNOSIS — E669 Obesity, unspecified: Secondary | ICD-10-CM

## 2019-02-10 DIAGNOSIS — F419 Anxiety disorder, unspecified: Secondary | ICD-10-CM | POA: Diagnosis present

## 2019-02-10 DIAGNOSIS — U071 COVID-19: Secondary | ICD-10-CM | POA: Diagnosis present

## 2019-02-10 DIAGNOSIS — J1282 Pneumonia due to coronavirus disease 2019: Secondary | ICD-10-CM | POA: Diagnosis present

## 2019-02-10 DIAGNOSIS — Z841 Family history of disorders of kidney and ureter: Secondary | ICD-10-CM | POA: Diagnosis not present

## 2019-02-10 DIAGNOSIS — Z7951 Long term (current) use of inhaled steroids: Secondary | ICD-10-CM | POA: Diagnosis not present

## 2019-02-10 DIAGNOSIS — J9601 Acute respiratory failure with hypoxia: Secondary | ICD-10-CM | POA: Diagnosis present

## 2019-02-10 DIAGNOSIS — E1122 Type 2 diabetes mellitus with diabetic chronic kidney disease: Secondary | ICD-10-CM | POA: Diagnosis present

## 2019-02-10 DIAGNOSIS — Z79890 Hormone replacement therapy (postmenopausal): Secondary | ICD-10-CM | POA: Diagnosis not present

## 2019-02-10 LAB — HIV ANTIBODY (ROUTINE TESTING W REFLEX): HIV Screen 4th Generation wRfx: NONREACTIVE

## 2019-02-10 LAB — CBG MONITORING, ED
Glucose-Capillary: 203 mg/dL — ABNORMAL HIGH (ref 70–99)
Glucose-Capillary: 318 mg/dL — ABNORMAL HIGH (ref 70–99)
Glucose-Capillary: 187 mg/dL — ABNORMAL HIGH (ref 70–99)
Glucose-Capillary: 162 mg/dL — ABNORMAL HIGH (ref 70–99)

## 2019-02-10 LAB — HEMOGLOBIN A1C
Hgb A1c MFr Bld: 9.8 % — ABNORMAL HIGH (ref 4.8–5.6)
Mean Plasma Glucose: 234.56 mg/dL

## 2019-02-10 LAB — ABO/RH: ABO/RH(D): O POS

## 2019-02-10 LAB — GLUCOSE, CAPILLARY: Glucose-Capillary: 165 mg/dL — ABNORMAL HIGH (ref 70–99)

## 2019-02-10 MED ORDER — INSULIN ASPART 100 UNIT/ML ~~LOC~~ SOLN
0.0000 [IU] | Freq: Three times a day (TID) | SUBCUTANEOUS | Status: DC
  Administered 2019-02-10: 12:00:00 11 [IU] via SUBCUTANEOUS
  Administered 2019-02-10: 10:00:00 5 [IU] via SUBCUTANEOUS
  Administered 2019-02-10: 19:00:00 3 [IU] via SUBCUTANEOUS
  Administered 2019-02-11 (×2): 8 [IU] via SUBCUTANEOUS
  Administered 2019-02-11: 10:00:00 2 [IU] via SUBCUTANEOUS
  Administered 2019-02-12: 18:00:00 8 [IU] via SUBCUTANEOUS
  Administered 2019-02-12: 11 [IU] via SUBCUTANEOUS
  Administered 2019-02-12 – 2019-02-13 (×2): 15 [IU] via SUBCUTANEOUS
  Administered 2019-02-13 (×2): 11 [IU] via SUBCUTANEOUS
  Administered 2019-02-14: 12:00:00 3 [IU] via SUBCUTANEOUS
  Filled 2019-02-10: qty 0.15

## 2019-02-10 MED ORDER — SODIUM CHLORIDE 0.9 % IV SOLN
200.0000 mg | Freq: Once | INTRAVENOUS | Status: AC
  Administered 2019-02-10: 04:00:00 200 mg via INTRAVENOUS
  Filled 2019-02-10: qty 200

## 2019-02-10 MED ORDER — ACETAMINOPHEN 325 MG PO TABS
650.0000 mg | ORAL_TABLET | Freq: Four times a day (QID) | ORAL | Status: DC | PRN
  Administered 2019-02-11 – 2019-02-14 (×4): 650 mg via ORAL
  Filled 2019-02-10 (×7): qty 2

## 2019-02-10 MED ORDER — INSULIN ASPART 100 UNIT/ML ~~LOC~~ SOLN
0.0000 [IU] | Freq: Every day | SUBCUTANEOUS | Status: DC
  Administered 2019-02-11: 4 [IU] via SUBCUTANEOUS
  Administered 2019-02-12 – 2019-02-13 (×2): 3 [IU] via SUBCUTANEOUS
  Filled 2019-02-10: qty 0.05

## 2019-02-10 MED ORDER — ENOXAPARIN SODIUM 40 MG/0.4ML ~~LOC~~ SOLN
40.0000 mg | SUBCUTANEOUS | Status: DC
  Administered 2019-02-10 – 2019-02-14 (×5): 40 mg via SUBCUTANEOUS
  Filled 2019-02-10 (×5): qty 0.4

## 2019-02-10 MED ORDER — ROSUVASTATIN CALCIUM 10 MG PO TABS
10.0000 mg | ORAL_TABLET | Freq: Every day | ORAL | Status: DC
  Administered 2019-02-10 – 2019-02-14 (×5): 10 mg via ORAL
  Filled 2019-02-10 (×5): qty 1

## 2019-02-10 MED ORDER — ONDANSETRON HCL 4 MG/2ML IJ SOLN
4.0000 mg | Freq: Four times a day (QID) | INTRAMUSCULAR | Status: DC | PRN
  Administered 2019-02-10: 23:00:00 4 mg via INTRAVENOUS
  Filled 2019-02-10: qty 2

## 2019-02-10 MED ORDER — PROVENTIL 90 MCG/ACT IN AERS: 2.0000 | INHALATION_SPRAY | RESPIRATORY_TRACT | Status: DC | PRN

## 2019-02-10 MED ORDER — ALBUTEROL SULFATE HFA 108 (90 BASE) MCG/ACT IN AERS
2.0000 | INHALATION_SPRAY | Freq: Four times a day (QID) | RESPIRATORY_TRACT | Status: DC
  Administered 2019-02-10 – 2019-02-14 (×17): 2 via RESPIRATORY_TRACT
  Filled 2019-02-10 (×3): qty 6.7

## 2019-02-10 MED ORDER — MOMETASONE FURO-FORMOTEROL FUM 200-5 MCG/ACT IN AERO
2.0000 | INHALATION_SPRAY | Freq: Two times a day (BID) | RESPIRATORY_TRACT | Status: DC
  Administered 2019-02-11 – 2019-02-14 (×7): 2 via RESPIRATORY_TRACT
  Filled 2019-02-10: qty 8.8

## 2019-02-10 MED ORDER — ONDANSETRON HCL 4 MG PO TABS: 4.0000 mg | ORAL_TABLET | Freq: Four times a day (QID) | ORAL | Status: DC | PRN

## 2019-02-10 MED ORDER — SODIUM CHLORIDE 0.9 % IV SOLN
100.0000 mg | Freq: Every day | INTRAVENOUS | Status: AC
  Administered 2019-02-11 – 2019-02-14 (×4): 100 mg via INTRAVENOUS
  Filled 2019-02-10 (×2): qty 100
  Filled 2019-02-10: qty 20
  Filled 2019-02-10: qty 100

## 2019-02-10 MED ORDER — LEVOTHYROXINE SODIUM 100 MCG PO TABS
100.0000 ug | ORAL_TABLET | Freq: Every day | ORAL | Status: DC
  Administered 2019-02-11 – 2019-02-14 (×4): 100 ug via ORAL
  Filled 2019-02-10 (×5): qty 1

## 2019-02-10 MED ORDER — PANTOPRAZOLE SODIUM 40 MG PO TBEC
80.0000 mg | DELAYED_RELEASE_TABLET | Freq: Every day | ORAL | Status: DC
  Administered 2019-02-10 – 2019-02-14 (×5): 80 mg via ORAL
  Filled 2019-02-10 (×5): qty 2

## 2019-02-10 NOTE — Progress Notes (Signed)
PROGRESS NOTE  Carrie Solis  DOB: 12-03-1950  PCP: Lucianne Lei, MD [id number]  DOA: 02/09/2019 Admitted From: Home  LOS: 0 days   Chief Complaint  Patient presents with  . Weakness   Brief narrative: Carrie Solis is a 69 y.o. female with PMH of obesity, HTN, DM2, OSA. Patient presented to the ED on 02/09/2019 with complaint of generalized weakness, body aches, chills. Pt daughter tested positive for COVID a couple of days ago. Patient developed symptoms about 1 week ago which continued to worsen and hence presented to the ED  ED Course: COVID antigen positive, no O2 requirement. Chest x-ray showed subtle bilateral scattered airspace opacities Admitted for generalized weakness.  Subjective: Patient was seen and examined this morning.  Was very tired.  Prefers to talk with eyes closed.  Not in physical distress.  Assessment/Plan: COVID infection -Presented with generalized weakness, body aches, chills -Chest imaging -chest x-ray showed subtle bilaterally scattered airspace opacities. -Treatment: IV Remdesivir for 5 days to complete on 1/7.  Not started on steroids because patient was not hypoxic. -Supportive care: Vitamin C, Zinc, inhalers, Tylenol, Antitussives - benzonatate, Mucinex -Oxygen - SpO2: 91 % on room air -Continue airborne/contact isolation precautions.  Type 2 diabetes mellitus -A1c 9.8 -Previously used to be on Levemir.  But currently on Ozempic once a week on Thursdays, Farxiga. -Not controlled, A1c 9.8.  Continue home meds.  Needs additional treatment as well. -Continue sliding scale insulin with Accu-Cheks.  Elevated creatinine -Creatinine 1.51 at presentation.  Unclear if she has underlying CKD. -Trend creatinine.  Hypertension  -Home meds include valsartan/HCTZ -Currently on hold.  Monitor blood pressure and creatinine.  Resume when appropriate.  Hypothyroidism -continue Synthroid.  Mobility: Encourage ambulation.  PT evaluation  ordered. Diet: Diabetic diet Fluid: Not on IV fluid DVT prophylaxis:  Lovenox Code Status:  Full code Family Communication:  Expected Discharge:  Anticipate discharge home in 1 to 2 days  Consultants:    Procedures:    Antimicrobials: Anti-infectives (From admission, onward)   Start     Dose/Rate Route Frequency Ordered Stop   02/11/19 1000  remdesivir 100 mg in sodium chloride 0.9 % 100 mL IVPB     100 mg 200 mL/hr over 30 Minutes Intravenous Daily 02/10/19 0303 02/15/19 0959   02/10/19 0315  remdesivir 200 mg in sodium chloride 0.9% 250 mL IVPB     200 mg 580 mL/hr over 30 Minutes Intravenous Once 02/10/19 0303 02/10/19 0459        Code Status: Full Code   Diet Order            Diet Carb Modified Fluid consistency: Thin; Room service appropriate? Yes  Diet effective now              Infusions:  . [START ON 02/11/2019] remdesivir 100 mg in NS 100 mL      Scheduled Meds: . albuterol  2 puff Inhalation Q6H  . enoxaparin (LOVENOX) injection  40 mg Subcutaneous Q24H  . insulin aspart  0-15 Units Subcutaneous TID WC  . insulin aspart  0-5 Units Subcutaneous QHS    PRN meds: acetaminophen, ondansetron **OR** ondansetron (ZOFRAN) IV   Objective: Vitals:   02/10/19 1545 02/10/19 1600  BP:  (!) 141/73  Pulse: (!) 107 95  Resp: (!) 31   Temp:    SpO2: 94% 91%    Intake/Output Summary (Last 24 hours) at 02/10/2019 1851 Last data filed at 02/10/2019 0459 Gross per 24 hour  Intake 790  ml  Output --  Net 790 ml   Filed Weights   02/09/19 1940  Weight: 104.3 kg   Weight change:  Body mass index is 37.12 kg/m.   Physical Exam: General exam: Appears calm and comfortable.  Feels tired. Skin: No rashes, lesions or ulcers. HEENT: Atraumatic, normocephalic, supple neck, no obvious bleeding Lungs: Clear to auscultation bilaterally CVS: Regular rate and rhythm, no murmur GI/Abd soft, nontender, nondistended, bowel sound present CNS: Alert, opens eyes on verbal  command, prefers to keep eyes closed, oriented x3 Psychiatry: Mood appropriate Extremities: No pedal edema, no calf tenderness  Data Review: I have personally reviewed the laboratory data and studies available.  Recent Labs  Lab 02/09/19 2130  WBC 7.9  NEUTROABS 6.5  HGB 16.4*  HCT 52.5*  MCV 91.1  PLT 217   Recent Labs  Lab 02/09/19 2130  NA 141  K 3.9  CL 100  CO2 28  GLUCOSE 233*  BUN 29*  CREATININE 1.51*  CALCIUM 8.6*    Terrilee Croak, MD  [facility] 02/10/2019

## 2019-02-10 NOTE — ED Notes (Signed)
Patient has been place in a hospital bed.

## 2019-02-10 NOTE — ED Notes (Signed)
Patient provided with warm blanket, chicken soup, and crackers.

## 2019-02-10 NOTE — ED Notes (Signed)
Pt said that she was feeling too weak to ambulate around the room at this time.

## 2019-02-10 NOTE — ED Notes (Signed)
Spoke with daughter, gave an update and room number on 4W.

## 2019-02-10 NOTE — H&P (Addendum)
History and Physical    Carrie Solis [id number] DOB: September 07, 1950 DOA: 02/09/2019  PCP: Lucianne Lei, MD  Patient coming from: Home  I have personally briefly reviewed patient's old medical records in [place]  Chief Complaint: Generalized weakness  HPI: Carrie Solis is a 69 y.o. female with medical history significant of DM2, HTN, OSA.  Patient presents to the ED for evaluation of generalized weakness, body aches, chills.  Pt daughter tested positive for COVID a couple of days ago.  Patient developed symptoms about 1 week ago.  No significant CP nor SOB.  Has nausea, only 1 episode of vomiting, no diarrhea, no abd pain.   ED Course: COVID positive, no O2 requirement, EDP wants admit due to generalized weakness.   Review of Systems: As per HPI, otherwise all review of systems negative.  Past Medical History:  Diagnosis Date  . ANXIETY DISORDER 07/17/2009  . Asthma   . Essential hypertension, benign 07/17/2009  . GERD 09/17/2009  . HYPOTHYROIDISM 09/17/2009  . IDDM 09/17/2009    History reviewed. No pertinent surgical history.   reports that she has never smoked. She has never used smokeless tobacco. She reports current alcohol use. No history on file for drug.  Allergies  Allergen Reactions  . Sulfonamide Derivatives     REACTION: rash    Family History  Problem Relation Age of Onset  . Diabetes Mother   . Heart disease Mother   . Heart disease Father   . Heart disease Sister   . Diabetes Brother   . Kidney disease Brother        on dialysis     Prior to Admission medications   Medication Sig Start Date End Date Taking? Authorizing Provider  albuterol (PROVENTIL) 90 MCG/ACT inhaler Inhale 2 puffs into the lungs as needed.      [provider]  budesonide-formoterol (SYMBICORT) 160-4.5 MCG/ACT inhaler Inhale 2 puffs into the lungs 2 (two) times daily.      [provider]  esomeprazole (NEXIUM) 40 MG capsule Take 40 mg by mouth  daily before breakfast.      [provider]  glucose blood (FREESTYLE LITE) test strip Two times a day dx 250.01 09/27/11   Renato Shin, MD  levothyroxine (SYNTHROID, LEVOTHROID) 100 MCG tablet Take 100 mcg by mouth daily.      [provider]  montelukast (SINGULAIR) 10 MG tablet Take 10 mg by mouth at bedtime.      [provider]  rosuvastatin (CRESTOR) 10 MG tablet Take 10 mg by mouth daily.      [provider]  valsartan (DIOVAN) 160 MG tablet Take 160 mg by mouth daily.      [provider]  valsartan-hydrochlorothiazide (DIOVAN-HCT) 160-25 MG tablet Take by mouth.    [provider]    Physical Exam: Vitals:   02/10/19 0030 02/10/19 0100 02/10/19 0116 02/10/19 0130  BP: 108/75 123/77 123/77 136/70  Pulse: (!) 104 (!) 102 (!) 108 94  Resp: (!) 27 15 16    Temp:   98.1 F (36.7 C)   TempSrc:   Oral   SpO2: 93% 98% 96% 94%  Weight:      Height:        Constitutional: NAD, calm, comfortable Eyes: PERRL, lids and conjunctivae normal ENMT: Mucous membranes are moist. Posterior pharynx clear of any exudate or lesions.Normal dentition.  Neck: normal, supple, no masses, no thyromegaly Respiratory: clear to auscultation bilaterally, no wheezing, no crackles. Normal  respiratory effort. No accessory muscle use.  Cardiovascular: Regular rate and rhythm, no murmurs / rubs / gallops. No extremity edema. 2+ pedal pulses. No carotid bruits.  Abdomen: no tenderness, no masses palpated. No hepatosplenomegaly. Bowel sounds positive.  Musculoskeletal: no clubbing / cyanosis. No joint deformity upper and lower extremities. Good ROM, no contractures. Normal muscle tone.  Skin: no rashes, lesions, ulcers. No induration Neurologic: CN 2-12 grossly intact. Sensation intact, DTR normal. Strength 5/5 in all 4.  Psychiatric: Normal judgment and insight. Alert and oriented x 3. Normal mood.    Labs on Admission: I have personally reviewed following  labs and imaging studies  CBC: Recent Labs  Lab 02/09/19 2130  WBC 7.9  NEUTROABS 6.5  HGB 16.4*  HCT 52.5*  MCV 91.1  PLT [id number]   Basic Metabolic Panel: Recent Labs  Lab 02/09/19 2130  NA 141  K 3.9  CL 100  CO2 28  GLUCOSE 233*  BUN 29*  CREATININE 1.51*  CALCIUM 8.6*   GFR: Estimated Creatinine Clearance: 42.9 mL/min (A) (by C-G formula based on SCr of 1.51 mg/dL (H)). Liver Function Tests: Recent Labs  Lab 02/09/19 2130  AST 34  ALT 20  ALKPHOS 67  BILITOT 0.7  PROT 7.5  ALBUMIN 3.1*   No results for input(s): LIPASE, AMYLASE in the last 168 hours. No results for input(s): AMMONIA in the last 168 hours. Coagulation Profile: No results for input(s): INR, PROTIME in the last 168 hours. Cardiac Enzymes: No results for input(s): CKTOTAL, CKMB, CKMBINDEX, TROPONINI in the last 168 hours. BNP (last 3 results) No results for input(s): PROBNP in the last 8760 hours. HbA1C: No results for input(s): HGBA1C in the last 72 hours. CBG: No results for input(s): GLUCAP in the last 168 hours. Lipid Profile: No results for input(s): CHOL, HDL, LDLCALC, TRIG, CHOLHDL, LDLDIRECT in the last 72 hours. Thyroid Function Tests: No results for input(s): TSH, T4TOTAL, FREET4, T3FREE, THYROIDAB in the last 72 hours. Anemia Panel: No results for input(s): VITAMINB12, FOLATE, FERRITIN, TIBC, IRON, RETICCTPCT in the last 72 hours. Urine analysis: No results found for: COLORURINE, APPEARANCEUR, LABSPEC, [place], GLUCOSEU, HGBUR, BILIRUBINUR, KETONESUR, PROTEINUR, UROBILINOGEN, NITRITE, LEUKOCYTESUR  Radiological Exams on Admission: DG Chest Port 1 View  Result Date: 02/09/2019 CLINICAL DATA:  Cough. EXAM: PORTABLE CHEST 1 VIEW COMPARISON:  September 04, 2003 FINDINGS: There are few subtle scattered airspace opacities bilaterally. There is no pneumothorax or large pleural effusion. The heart size is normal. Aortic calcifications are noted. There are degenerative changes of the left  glenohumeral joint. IMPRESSION: Subtle scattered airspace opacities which can be seen in patients with viral pneumonia. Electronically Signed   By: Constance Holster M.D.   On: 02/09/2019 20:26    EKG: Independently reviewed.  Assessment/Plan Principal Problem:   COVID-19 virus infection Active Problems:   Hypothyroidism   Essential hypertension, benign   Diabetes mellitus type 2 in obese ([place])    1. COVID-19 - 1. COVID pathway 2. remdesivir 3. No O2 requirement so no indication for decadron at this time 4. Daily labs 5. Cont pulse ox 2. DM2 - 1. Apparently patient no longer on levemir daily at home (used to be on 140 units it looks like a few years back per Dr. Cordelia Pen notes), now just takes Ozempic once a week on Thursdays 2. Mod scale SSI AC/HS for the moment 3. HTN - 1. BPs running low 110s right now 2. Med rec pending 3. Probably hold home BP meds for the moment though  4. Hypothyroidism - 1. Continue synthroid when med rec completed. 5. Renal insufficiency - 1. Creat of 1.5 today 2. Chronic? Not sure of patients baseline 3. Check daily labs while here 4. And hold home BP meds for the moment.  DVT prophylaxis: Lovenox Code Status: Full Family Communication: No family in room Disposition Plan: Home after admit Consults called: None Admission status: Place in 56    Brittnay Pigman, [facility]  How to contact the [facility] Attending or Consulting provider [place] or covering provider during after hours [place], for this patient?  1. Check the care team in [facility] and look for a) attending/consulting [facility] provider listed and b) the [facility] team listed 2. Log into [email or web address]  Amion Physician Scheduling and messaging for groups and whole hospitals  On call and physician scheduling software for group practices, residents, hospitalists and other medical providers for call, clinic, rotation and shift schedules. On[employer] is a hospital-wide system for  scheduling doctors and paging doctors on call. EasyPlot is for scientific plotting and data analysis.  [email or web address]  and use [place]'s universal password to access. If you do not have the password, please contact the hospital operator.  3. Locate the [facility] provider you are looking for under [facility] and page to a number that you can be directly reached. 4. If you still have difficulty reaching the provider, please page the [facility] (Director on Call) for the Hospitalists listed on amion for assistance.  02/10/2019, 3:10 AM

## 2019-02-11 ENCOUNTER — Encounter (HOSPITAL_COMMUNITY): Payer: Self-pay | Admitting: Internal Medicine

## 2019-02-11 DIAGNOSIS — J1282 Pneumonia due to coronavirus disease 2019: Secondary | ICD-10-CM | POA: Diagnosis present

## 2019-02-11 DIAGNOSIS — U071 COVID-19: Secondary | ICD-10-CM | POA: Diagnosis present

## 2019-02-11 LAB — GLUCOSE, CAPILLARY
Glucose-Capillary: 149 mg/dL — ABNORMAL HIGH (ref 70–99)
Glucose-Capillary: 256 mg/dL — ABNORMAL HIGH (ref 70–99)
Glucose-Capillary: 279 mg/dL — ABNORMAL HIGH (ref 70–99)
Glucose-Capillary: 340 mg/dL — ABNORMAL HIGH (ref 70–99)

## 2019-02-11 LAB — COMPREHENSIVE METABOLIC PANEL
ALT: 16 U/L (ref 0–44)
AST: 25 U/L (ref 15–41)
Albumin: 2.7 g/dL — ABNORMAL LOW (ref 3.5–5.0)
Alkaline Phosphatase: 52 U/L (ref 38–126)
Anion gap: 10 (ref 5–15)
BUN: 28 mg/dL — ABNORMAL HIGH (ref 8–23)
CO2: 28 mmol/L (ref 22–32)
Calcium: 8.5 mg/dL — ABNORMAL LOW (ref 8.9–10.3)
Chloride: 106 mmol/L (ref 98–111)
Creatinine, Ser: 1.14 mg/dL — ABNORMAL HIGH (ref 0.44–1.00)
GFR calc Af Amer: 57 mL/min — ABNORMAL LOW (ref >60)
GFR calc non Af Amer: 49 mL/min — ABNORMAL LOW (ref >60)
Glucose, Bld: 162 mg/dL — ABNORMAL HIGH (ref 70–99)
Potassium: 3.4 mmol/L — ABNORMAL LOW (ref 3.5–5.1)
Sodium: 144 mmol/L (ref 135–145)
Total Bilirubin: 0.5 mg/dL (ref 0.3–1.2)
Total Protein: 6.3 g/dL — ABNORMAL LOW (ref 6.5–8.1)

## 2019-02-11 LAB — CBC WITH DIFFERENTIAL/PLATELET
Abs Immature Granulocytes: 0.02 10*3/uL (ref 0.00–0.07)
Basophils Absolute: 0 10*3/uL (ref 0.0–0.1)
Basophils Relative: 1 %
Eosinophils Absolute: 0 10*3/uL (ref 0.0–0.5)
Eosinophils Relative: 0 %
HCT: 47.2 % — ABNORMAL HIGH (ref 36.0–46.0)
Hemoglobin: 14.7 g/dL (ref 12.0–15.0)
Immature Granulocytes: 0 %
Lymphocytes Relative: 33 %
Lymphs Abs: 1.7 10*3/uL (ref 0.7–4.0)
MCH: 28.2 pg (ref 26.0–34.0)
MCHC: 31.1 g/dL (ref 30.0–36.0)
MCV: 90.6 fL (ref 80.0–100.0)
Monocytes Absolute: 0.3 10*3/uL (ref 0.1–1.0)
Monocytes Relative: 6 %
Neutro Abs: 3.1 10*3/uL (ref 1.7–7.7)
Neutrophils Relative %: 60 %
Platelets: 228 10*3/uL (ref 150–400)
RBC: 5.21 MIL/uL — ABNORMAL HIGH (ref 3.87–5.11)
RDW: 14.2 % (ref 11.5–15.5)
WBC: 5.1 10*3/uL (ref 4.0–10.5)
nRBC: 0 % (ref 0.0–0.2)

## 2019-02-11 LAB — D-DIMER, QUANTITATIVE: D-Dimer, Quant: 0.75 ug/mL-FEU — ABNORMAL HIGH (ref 0.00–0.50)

## 2019-02-11 LAB — C-REACTIVE PROTEIN: CRP: 17.7 mg/dL — ABNORMAL HIGH (ref <1.0)

## 2019-02-11 MED ORDER — DEXAMETHASONE SODIUM PHOSPHATE 10 MG/ML IJ SOLN
6.0000 mg | INTRAMUSCULAR | Status: DC
  Administered 2019-02-11 – 2019-02-12 (×2): 6 mg via INTRAVENOUS
  Filled 2019-02-11 (×2): qty 1

## 2019-02-11 MED ORDER — INSULIN GLARGINE 100 UNIT/ML ~~LOC~~ SOLN
10.0000 [IU] | Freq: Every day | SUBCUTANEOUS | Status: DC
  Administered 2019-02-12 – 2019-02-13 (×2): 10 [IU] via SUBCUTANEOUS
  Filled 2019-02-11 (×3): qty 0.1

## 2019-02-11 NOTE — Progress Notes (Signed)
Message sent to on call provider RE pt has not voided since arrival to the floor at 0000. RN has encouraged pt to to increase her PO intake. Pt denies any discomfort and does not feel like she needs to void at this time. No new orders at this time per on call provider.  Will continue to monitor

## 2019-02-11 NOTE — Progress Notes (Signed)
PROGRESS NOTE  Carrie Solis  DOB: 06-02-1950  PCP: Lucianne Lei, MD [id number]  DOA: 02/09/2019 Admitted From: Home  LOS: 1 day   Chief Complaint  Patient presents with  . Weakness   Brief narrative: Carrie Solis is a 69 y.o. female with PMH of obesity, HTN, DM2, OSA. Patient presented to the ED on 02/09/2019 with complaint of generalized weakness, body aches, chills. Pt daughter tested positive for COVID a couple of days ago. Patient developed symptoms about 1 week ago which continued to worsen and hence presented to the ED  ED Course: COVID antigen positive, no O2 requirement. Chest x-ray showed subtle bilateral scattered airspace opacities Admitted for generalized weakness.  Subjective: Patient was seen and examined this morning.   Still feels weak but was able to take few steps with walker today.  Remains on low-flow oxygen.  She states that her husband is also hospitalized with Covid at [place].  Assessment/Plan: COVID infection Acute respiratory failure with hypoxia -Presented with generalized weakness, body aches, chills -Chest imaging -chest x-ray showed subtle bilaterally scattered airspace opacities. -Treatment: IV Remdesivir for 5 days to complete on 1/7.  Not started on steroids because patient was not hypoxic.  But currently she is requiring oxygen.  I will start on Decadron as well. -Supportive care: Vitamin C, Zinc, inhalers, Tylenol, Antitussives - benzonatate, Mucinex -Oxygen - SpO2: 95 % O2 Flow Rate (L/min): 2 L/min on room air -Continue airborne/contact isolation precautions. -Biomarkers as below.   Recent Labs  Lab 02/09/19 2130 02/11/19 0441  WBC 7.9 5.1   Recent Labs    02/11/19 0441  DDIMER 0.75*  CRP 17.7*   Type 2 diabetes mellitus -A1c 9.8 -Previously used to be on Levemir.  But currently on Ozempic once a week on Thursdays, Farxiga. -Not controlled, A1c 9.8.  Continue home meds. -Her blood sugar is already running in 200s today.   With addition of Decadron, blood sugar anticipated to run higher.  I will start the patient on Lantus 10 units from tomorrow morning.  -Continue sliding scale insulin with Accu-Cheks.  AKI -Creatinine 1.51 at presentation.  Unclear if she has underlying CKD. -Creatinine improving, 1.14 today.  Hypertension  -Home meds include valsartan/HCTZ -Currently on hold.  Monitor blood pressure and creatinine.  Resume when appropriate.  Hypothyroidism -continue Synthroid.  Mobility: Encourage ambulation.  PT evaluation obtained.  Home health PT recommended. Diet: Diabetic diet Fluid: Not on IV fluid.  Diuretics on hold DVT prophylaxis:  Lovenox Code Status:  Full code Family Communication:  RN updated son today. Expected Discharge:  Last dose of remdesivir on 1/7.  Per husband is also hospitalized with Covid at [place].  No other family support at home.  Consultants:    Procedures:    Antimicrobials: Anti-infectives (From admission, onward)   Start     Dose/Rate Route Frequency Ordered Stop   02/11/19 1000  remdesivir 100 mg in sodium chloride 0.9 % 100 mL IVPB     100 mg 200 mL/hr over 30 Minutes Intravenous Daily 02/10/19 0303 02/15/19 0959   02/10/19 0315  remdesivir 200 mg in sodium chloride 0.9% 250 mL IVPB     200 mg 580 mL/hr over 30 Minutes Intravenous Once 02/10/19 0303 02/10/19 0459        Code Status: Full Code   Diet Order            Diet Carb Modified Fluid consistency: Thin; Room service appropriate? Yes  Diet effective now  Infusions:  . remdesivir 100 mg in NS 100 mL 100 mg (02/11/19 0944)    Scheduled Meds: . albuterol  2 puff Inhalation Q6H  . dexamethasone (DECADRON) injection  6 mg Intravenous Q24H  . enoxaparin (LOVENOX) injection  40 mg Subcutaneous Q24H  . insulin aspart  0-15 Units Subcutaneous TID WC  . insulin aspart  0-5 Units Subcutaneous QHS  . [START ON 02/12/2019] insulin glargine  10 Units Subcutaneous Daily  . levothyroxine   100 mcg Oral Q0600  . mometasone-formoterol  2 puff Inhalation BID  . pantoprazole  80 mg Oral Q1200  . rosuvastatin  10 mg Oral Daily    PRN meds: acetaminophen, ondansetron **OR** ondansetron (ZOFRAN) IV   Objective: Vitals:   02/11/19 0503 02/11/19 1233  BP: 122/73 94/68  Pulse: 91 95  Resp: 16 19  Temp: 97.7 F (36.5 C) (!) 97.4 F (36.3 C)  SpO2: 96% 95%    Intake/Output Summary (Last 24 hours) at 02/11/2019 1612 Last data filed at 02/11/2019 1505 Gross per 24 hour  Intake 220 ml  Output --  Net 220 ml   Filed Weights   02/09/19 1940  Weight: 104.3 kg   Weight change:  Body mass index is 37.12 kg/m.   Physical Exam: General exam: Appears calm and comfortable.  Feels tired. Skin: No rashes, lesions or ulcers. HEENT: Atraumatic, normocephalic, supple neck, no obvious bleeding Lungs: Clear to auscultation bilaterally CVS: Regular rate and rhythm, no murmur GI/Abd soft, nontender, nondistended, bowel sound present CNS: Alert, awake, oriented x3.  Able to follow commands. Psychiatry: Mood appropriate Extremities: No pedal edema, no calf tenderness  Data Review: I have personally reviewed the laboratory data and studies available.  Recent Labs  Lab 02/09/19 2130 02/11/19 0441  WBC 7.9 5.1  NEUTROABS 6.5 3.1  HGB 16.4* 14.7  HCT 52.5* 47.2*  MCV 91.1 90.6  PLT 217 228   Recent Labs  Lab 02/09/19 2130 02/11/19 0441  NA 141 144  K 3.9 3.4*  CL 100 106  CO2 28 28  GLUCOSE 233* 162*  BUN 29* 28*  CREATININE 1.51* 1.14*  CALCIUM 8.6* 8.5*    Terrilee Croak, MD  [facility] 02/11/2019

## 2019-02-11 NOTE — Progress Notes (Signed)
Updated patient son and daughter RE: patient condition per patient request.

## 2019-02-11 NOTE — Progress Notes (Signed)
Pt upset that staff has to stick her finger for blood sugar checks. Pt states she has her own meter from home that was prescribed by her doctor so this meter should be good enough. RN educated pt that per policy we need to use our machines and that I can not administer insulin based on her meter from home. Pt agreed to let staff check her sugar "one last time tonight, then not again." Pt states she is calling her doctor to ask about this.  Will continue to monitor.

## 2019-02-11 NOTE — Evaluation (Signed)
Physical Therapy Evaluation Patient Details Name: Carrie Solis MRN: [id number] DOB: October 26, 1950 Today's Date: 02/11/2019   History of Present Illness  Carrie Solis is a 69 y.o. female with medical history significant of DM2, HTN, OSA.  Patient presents to the ED for evaluation of generalized weakness, body aches, chills and diagnosed with COVID.  Clinical Impression  Pt admitted with above diagnosis. Pt currently with functional limitations due to the deficits listed below (see PT Problem List). Pt will benefit from skilled PT to increase their independence and safety with mobility to allow discharge to the venue listed below.  Pt was independent prior to admission and now requiring RW for 35' gait in room. Her o2 >90% on room air throughout gait, but complains of LE weakness.  Recommend HHPT, 3-1 BSC and 24/7 family support initially.  Discussed home safety and having family A her on stairs to enter home.  She has a RW at home already.       Follow Up Recommendations Home health PT;Supervision/Assistance - 24 hour    Equipment Recommendations  3in1 (PT)    Recommendations for Other Services       Precautions / Restrictions Precautions Precautions: None      Mobility  Bed Mobility Overal bed mobility: Needs Assistance Bed Mobility: Supine to Sit     Supine to sit: Min guard;Min assist     General bed mobility comments: slow transition to EOB, but min/guard only with use of rail with bed flat. MIN A to A getting scooted fully forward.  Transfers Overall transfer level: Needs assistance Equipment used: Rolling walker (2 wheeled) Transfers: Sit to/from Stand Sit to Stand: Min guard         General transfer comment: close min/guard with cues for hand placement. No A, but hard effort on pt to achieve standing.  Ambulation/Gait Ambulation/Gait assistance: Min guard Gait Distance (Feet): 35 Feet Assistive device: Rolling walker (2 wheeled) Gait Pattern/deviations:  Step-through pattern;Decreased step length - right;Decreased step length - left Gait velocity: decreased   General Gait Details: Amb in room with slow deliberate steps. o2 on room air 90% and higher throughout ambulation. Pt with no c/o of SOB, but reports feeling weak.  Stairs            Wheelchair Mobility    Modified Rankin (Stroke Patients Only)       Balance Overall balance assessment: Needs assistance Sitting-balance support: Feet supported Sitting balance-Leahy Scale: Good     Standing balance support: Bilateral upper extremity supported Standing balance-Leahy Scale: Poor Standing balance comment: requires UE support                             Pertinent Vitals/Pain Pain Assessment: Faces Faces Pain Scale: Hurts a little bit Pain Location: stomachace Pain Descriptors / Indicators: Aching Pain Intervention(s): Monitored during session    Home Living Family/patient expects to be discharged to:: Private residence Living Arrangements: Spouse/significant other Available Help at Discharge: Family;Available 24 hours/day Type of Home: House Home Access: Stairs to enter Entrance Stairs-Rails: Left Entrance Stairs-Number of Steps: 6 Home Layout: Two level;Able to live on main level with bedroom/bathroom Home Equipment: Gilford Rile - 2 wheels Additional Comments: Husband currently at [facility] hospital with COVID    Prior Function Level of Independence: Independent               Hand Dominance        Extremity/Trunk Assessment   Upper  Extremity Assessment Upper Extremity Assessment: Generalized weakness    Lower Extremity Assessment Lower Extremity Assessment: Generalized weakness    Cervical / Trunk Assessment Cervical / Trunk Assessment: Normal  Communication   Communication: No difficulties  Cognition Arousal/Alertness: Awake/alert Behavior During Therapy: WFL for tasks assessed/performed Overall Cognitive Status: Within Functional Limits  for tasks assessed                                        General Comments      Exercises     Assessment/Plan    PT Assessment Patient needs continued PT services  PT Problem List Decreased strength;Decreased activity tolerance;Decreased balance;Decreased mobility;Decreased knowledge of use of DME       PT Treatment Interventions DME instruction;Gait training;Functional mobility training;Stair training;Therapeutic activities;Therapeutic exercise;Balance training;Neuromuscular re-education;Patient/family education    PT Goals (Current goals can be found in the Care Plan section)  Acute Rehab PT Goals Patient Stated Goal: get stronger PT Goal Formulation: With patient Time For Goal Achievement: 02/25/19 Potential to Achieve Goals: Good    Frequency Min 3X/week   Barriers to discharge Decreased caregiver support;Inaccessible home environment husband currently hospitalized. Thinks her children can probably help. 6 steps to enter home    Co-evaluation               AM-PAC PT "6 Clicks" Mobility  Outcome Measure Help needed turning from your back to your side while in a flat bed without using bedrails?: None Help needed moving from lying on your back to sitting on the side of a flat bed without using bedrails?: A Little Help needed moving to and from a bed to a chair (including a wheelchair)?: A Little Help needed standing up from a chair using your arms (e.g., wheelchair or bedside chair)?: A Little Help needed to walk in hospital room?: A Little Help needed climbing 3-5 steps with a railing? : A Lot 6 Click Score: 18    End of Session Equipment Utilized During Treatment: Gait belt Activity Tolerance: Patient limited by fatigue Patient left: in chair;with call bell/phone within reach Nurse Communication: Mobility status PT Visit Diagnosis: Muscle weakness (generalized) (M62.81);Difficulty in walking, not elsewhere classified (R26.2)    Time:  [id number] PT Time Calculation (min) (ACUTE ONLY): 35 min   Charges:   PT Evaluation $PT Eval Moderate Complexity: 1 Mod PT Treatments $Gait Training: 8-22 mins        Carrie Solis, [place] Pager [phone] 02/11/2019   Carrie Solis 02/11/2019, 1:12 PM

## 2019-02-11 NOTE — Progress Notes (Signed)
Spoke with pt daughter and gave her an update. Family member is concerned about her mother getting her finger pricked for blood sugar checks when pt has the machine from home which she prefers. RN educated family that we must use our meters when pt is in the hospital. Family requested RN write a note so attending MD will see and change her orders. RN encouraged pt to discuss this with her MD during rounds in the morning.  Will continue to monitor.

## 2019-02-12 LAB — COMPREHENSIVE METABOLIC PANEL
ALT: 15 U/L (ref 0–44)
AST: 24 U/L (ref 15–41)
Albumin: 2.7 g/dL — ABNORMAL LOW (ref 3.5–5.0)
Alkaline Phosphatase: 56 U/L (ref 38–126)
Anion gap: 11 (ref 5–15)
BUN: 30 mg/dL — ABNORMAL HIGH (ref 8–23)
CO2: 25 mmol/L (ref 22–32)
Calcium: 8.3 mg/dL — ABNORMAL LOW (ref 8.9–10.3)
Chloride: 99 mmol/L (ref 98–111)
Creatinine, Ser: 1.22 mg/dL — ABNORMAL HIGH (ref 0.44–1.00)
GFR calc Af Amer: 52 mL/min — ABNORMAL LOW (ref >60)
GFR calc non Af Amer: 45 mL/min — ABNORMAL LOW (ref >60)
Glucose, Bld: 442 mg/dL — ABNORMAL HIGH (ref 70–99)
Potassium: 4.3 mmol/L (ref 3.5–5.1)
Sodium: 135 mmol/L (ref 135–145)
Total Bilirubin: 1 mg/dL (ref 0.3–1.2)
Total Protein: 6.3 g/dL — ABNORMAL LOW (ref 6.5–8.1)

## 2019-02-12 LAB — C-REACTIVE PROTEIN: CRP: 15.9 mg/dL — ABNORMAL HIGH (ref <1.0)

## 2019-02-12 LAB — CBC WITH DIFFERENTIAL/PLATELET
Abs Immature Granulocytes: 0.02 10*3/uL (ref 0.00–0.07)
Basophils Absolute: 0 10*3/uL (ref 0.0–0.1)
Basophils Relative: 1 %
Eosinophils Absolute: 0 10*3/uL (ref 0.0–0.5)
Eosinophils Relative: 0 %
HCT: 47 % — ABNORMAL HIGH (ref 36.0–46.0)
Hemoglobin: 14.5 g/dL (ref 12.0–15.0)
Immature Granulocytes: 1 %
Lymphocytes Relative: 22 %
Lymphs Abs: 0.7 10*3/uL (ref 0.7–4.0)
MCH: 27.9 pg (ref 26.0–34.0)
MCHC: 30.9 g/dL (ref 30.0–36.0)
MCV: 90.6 fL (ref 80.0–100.0)
Monocytes Absolute: 0.1 10*3/uL (ref 0.1–1.0)
Monocytes Relative: 4 %
Neutro Abs: 2.4 10*3/uL (ref 1.7–7.7)
Neutrophils Relative %: 72 %
Platelets: 195 10*3/uL (ref 150–400)
RBC: 5.19 MIL/uL — ABNORMAL HIGH (ref 3.87–5.11)
RDW: 14 % (ref 11.5–15.5)
WBC: 3.2 10*3/uL — ABNORMAL LOW (ref 4.0–10.5)
nRBC: 0 % (ref 0.0–0.2)

## 2019-02-12 LAB — GLUCOSE, CAPILLARY
Glucose-Capillary: 333 mg/dL — ABNORMAL HIGH (ref 70–99)
Glucose-Capillary: 355 mg/dL — ABNORMAL HIGH (ref 70–99)
Glucose-Capillary: 295 mg/dL — ABNORMAL HIGH (ref 70–99)
Glucose-Capillary: 286 mg/dL — ABNORMAL HIGH (ref 70–99)

## 2019-02-12 LAB — D-DIMER, QUANTITATIVE: D-Dimer, Quant: 0.72 ug/mL-FEU — ABNORMAL HIGH (ref 0.00–0.50)

## 2019-02-12 MED ORDER — INSULIN ASPART 100 UNIT/ML ~~LOC~~ SOLN
4.0000 [IU] | Freq: Three times a day (TID) | SUBCUTANEOUS | Status: DC
  Administered 2019-02-13 – 2019-02-14 (×5): 4 [IU] via SUBCUTANEOUS

## 2019-02-12 MED ORDER — SODIUM CHLORIDE 0.9 % IV SOLN
INTRAVENOUS | Status: DC | PRN
  Administered 2019-02-12: 250 mL via INTRAVENOUS

## 2019-02-12 NOTE — Progress Notes (Addendum)
Patient asked about getting insulin, instead of Ozempic.  Patient educated regarding insulin (Novolog and Lantus) and other medications.  Patient agreed to take both insulins and agreed to use hospital's glucometer.

## 2019-02-12 NOTE — Progress Notes (Signed)
Inpatient Diabetes Program Recommendations  AACE/ADA: New Consensus Statement on Inpatient Glycemic Control (2015)  Target Ranges:  Prepandial:   less than 140 mg/dL      Peak postprandial:   less than 180 mg/dL (1-2 hours)      Critically ill patients:  140 - 180 mg/dL   Results for Carrie Solis, Carrie Solis (MRN [id number]) as of 02/12/2019 08:18  Ref. Range 02/11/2019 08:06 02/11/2019 12:28 02/11/2019 17:09 02/11/2019 22:39  Glucose-Capillary Latest Ref Range: 70 - 99 mg/dL 149 (H)  2 units NOVOLOG  256 (H)  8 units NOVOLOG  279 (H)  8 units NOVOLOG  340 (H)  4 units NOVOLOG    Results for Carrie Solis, Carrie Solis (MRN [id number]) as of 02/12/2019 08:18  Ref. Range 02/12/2019 07:52  Glucose-Capillary Latest Ref Range: 70 - 99 mg/dL 333 (H)    Home DM Meds: Farxiga 10 mg Daily       Ozempic 1 mg Qweek  Current Orders: Lantus 10 units Daily      Novolog Moderate Correction Scale/ SSI (0-15 units) TID AC + HS     Getting Decadron 6 mg Daily.  Home Farxiga and Ozempic meds for diabetes on hold.    MD- Note Lantus 10 units Daily to start this AM.  May also consider adding Novolog Meal Coverage as well while patient getting Decadron:  Novolog 4 units TID with meals  (Please add the following Hold Parameters: Hold if pt eats <50% of meal, Hold if pt NPO)      --Will follow patient during hospitalization--  Wyn Quaker RN, MSN, CDE Diabetes Coordinator Inpatient Glycemic Control Team Team Pager: [phone] (8a-5p)

## 2019-02-12 NOTE — Plan of Care (Signed)
  Problem: Education: Goal: Knowledge of General Education information will improve Description: Including pain rating scale, medication(s)/side effects and non-pharmacologic comfort measures Outcome: Progressing   Problem: Health Behavior/Discharge Planning: Goal: Ability to manage health-related needs will improve Outcome: Progressing   Problem: Activity: Goal: Risk for activity intolerance will decrease Outcome: Progressing   Problem: Coping: Goal: Level of anxiety will decrease Outcome: Progressing   Problem: Elimination: Goal: Will not experience complications related to bowel motility Outcome: Progressing Goal: Will not experience complications related to urinary retention Outcome: Progressing   Problem: Safety: Goal: Ability to remain free from injury will improve Outcome: Progressing

## 2019-02-12 NOTE — Progress Notes (Signed)
PROGRESS NOTE  Carrie Solis  DOB: Apr 06, 1950  PCP: Lucianne Lei, MD [id number]  DOA: 02/09/2019 Admitted From: Home  LOS: 2 days   Chief Complaint  Patient presents with  . Weakness   Brief narrative: Carrie Solis is a 69 y.o. female with PMH of obesity, HTN, DM2, OSA. Patient presented to the ED on 02/09/2019 with complaint of generalized weakness, body aches, chills. Pt daughter tested positive for COVID a couple of days ago. Patient developed symptoms about 1 week ago which continued to worsen and hence presented to the ED  ED Course: COVID antigen positive, no O2 requirement. Chest x-ray showed subtle bilateral scattered airspace opacities Admitted for generalized weakness.  Subjective: Patient was seen and examined this morning.   Still feels weak but was able to take few steps with walker today.  Remains on low-flow oxygen.  She states that her husband is also hospitalized with Covid at [place].  Assessment/Plan: COVID infection Acute respiratory failure with hypoxia -Presented with generalized weakness, body aches, chills -Chest imaging -chest x-ray showed subtle bilaterally scattered airspace opacities. -Treatment: IV Decadron, IV Remdesivir for 5 days to complete on 1/7.  Not started on steroids because patient was not hypoxic.  But currently she is requiring oxygen.  -Supportive care: Vitamin C, Zinc, inhalers, Tylenol, Antitussives - benzonatate, Mucinex -Oxygen - SpO2: 95 % O2 Flow Rate (L/min): 1 L/min on room air -Continue airborne/contact isolation precautions. -Biomarkers trend as below.   Recent Labs  Lab 02/09/19 2130 02/11/19 0441 02/12/19 0308  WBC 7.9 5.1 3.2*   Recent Labs    02/11/19 0441 02/12/19 0308  DDIMER 0.75* 0.72*  CRP 17.7* 15.9*   Type 2 diabetes mellitus -A1c 9.8 -Previously used to be on Levemir.  But currently on Ozempic once a week on Thursdays, Farxiga. -Not controlled, A1c 9.8.  Continue home meds. -Her blood sugar is  running high in 200s after addition of Decadron.  Insulin dose have been adjusted.  -Currently on Lantus 10 units daily, Premeal NovoLog scheduled as well as sliding scale insulin.   AKI -Creatinine 1.51 at presentation.  Unclear if she has underlying CKD. -Creatinine improving, 1.22 today.  Hypertension  -Home meds include valsartan/HCTZ -Currently on hold.  Monitor blood pressure and creatinine.  Resume when appropriate.  Hypothyroidism -continue Synthroid.  Mobility: Encourage ambulation. PT evaluation obtained. Home health PT recommended. Diet: Diabetic diet Fluid: Not on IV fluid. Diuretics on hold DVT prophylaxis:  Lovenox Code Status:  Full code Family Communication:  I called and updated patient's son Dr. Cathlean Sauer at [facility].  Expected Discharge:  Final dose of remdesivir to be given on 1/7.  Per husband is also hospitalized with Covid at [place].  No other family support at home.  Consultants:    Procedures:    Antimicrobials: Anti-infectives (From admission, onward)   Start     Dose/Rate Route Frequency Ordered Stop   02/11/19 1000  remdesivir 100 mg in sodium chloride 0.9 % 100 mL IVPB     100 mg 200 mL/hr over 30 Minutes Intravenous Daily 02/10/19 0303 02/15/19 0959   02/10/19 0315  remdesivir 200 mg in sodium chloride 0.9% 250 mL IVPB     200 mg 580 mL/hr over 30 Minutes Intravenous Once 02/10/19 0303 02/10/19 0459        Code Status: Full Code   Diet Order            Diet Carb Modified Fluid consistency: Thin; Room service appropriate? Yes  Diet  effective now              Infusions:  . sodium chloride 250 mL (02/12/19 1121)  . remdesivir 100 mg in NS 100 mL 100 mg (02/12/19 1122)    Scheduled Meds: . albuterol  2 puff Inhalation Q6H  . dexamethasone (DECADRON) injection  6 mg Intravenous Q24H  . enoxaparin (LOVENOX) injection  40 mg Subcutaneous Q24H  . insulin aspart  0-15 Units Subcutaneous TID WC  . insulin aspart  0-5 Units Subcutaneous  QHS  . insulin aspart  4 Units Subcutaneous TID WC  . insulin glargine  10 Units Subcutaneous Daily  . levothyroxine  100 mcg Oral Q0600  . mometasone-formoterol  2 puff Inhalation BID  . pantoprazole  80 mg Oral Q1200  . rosuvastatin  10 mg Oral Daily    PRN meds: sodium chloride, acetaminophen, ondansetron **OR** ondansetron (ZOFRAN) IV   Objective: Vitals:   02/12/19 1238 02/12/19 1411  BP:  121/69  Pulse:  88  Resp:  16  Temp:  97.8 F (36.6 C)  SpO2: 95% 95%    Intake/Output Summary (Last 24 hours) at 02/12/2019 1503 Last data filed at 02/11/2019 1505 Gross per 24 hour  Intake 100 ml  Output --  Net 100 ml   Filed Weights   02/09/19 1940  Weight: 104.3 kg   Weight change:  Body mass index is 37.12 kg/m.   Physical Exam: General exam: Appears calm and comfortable.  Skin: No rashes, lesions or ulcers. HEENT: Atraumatic, normocephalic, supple neck, no obvious bleeding Lungs: Clear to auscultate bilaterally.  No crackles, no wheezing. CVS: Regular rate and rhythm, no murmur GI/Abd soft, nontender, nondistended, bowel sound present CNS: Alert, awake, oriented x3.  Able to follow commands. Psychiatry: Mood appropriate Extremities: No pedal edema, no calf tenderness  Data Review: I have personally reviewed the laboratory data and studies available.  Recent Labs  Lab 02/09/19 2130 02/11/19 0441 02/12/19 0308  WBC 7.9 5.1 3.2*  NEUTROABS 6.5 3.1 2.4  HGB 16.4* 14.7 14.5  HCT 52.5* 47.2* 47.0*  MCV 91.1 90.6 90.6  PLT 217 228 195   Recent Labs  Lab 02/09/19 2130 02/11/19 0441 02/12/19 0308  NA 141 144 135  K 3.9 3.4* 4.3  CL 100 106 99  CO2 28 28 25   GLUCOSE 233* 162* 442*  BUN 29* 28* 30*  CREATININE 1.51* 1.14* 1.22*  CALCIUM 8.6* 8.5* 8.3*    Terrilee Croak, MD  [facility] 02/12/2019

## 2019-02-13 LAB — CBC WITH DIFFERENTIAL/PLATELET
Abs Immature Granulocytes: 0.03 10*3/uL (ref 0.00–0.07)
Basophils Absolute: 0 10*3/uL (ref 0.0–0.1)
Basophils Relative: 1 %
Eosinophils Absolute: 0 10*3/uL (ref 0.0–0.5)
Eosinophils Relative: 0 %
HCT: 46.1 % — ABNORMAL HIGH (ref 36.0–46.0)
Hemoglobin: 14.4 g/dL (ref 12.0–15.0)
Immature Granulocytes: 1 %
Lymphocytes Relative: 15 %
Lymphs Abs: 0.8 10*3/uL (ref 0.7–4.0)
MCH: 28.2 pg (ref 26.0–34.0)
MCHC: 31.2 g/dL (ref 30.0–36.0)
MCV: 90.2 fL (ref 80.0–100.0)
Monocytes Absolute: 0.2 10*3/uL (ref 0.1–1.0)
Monocytes Relative: 3 %
Neutro Abs: 4.3 10*3/uL (ref 1.7–7.7)
Neutrophils Relative %: 80 %
Platelets: 278 10*3/uL (ref 150–400)
RBC: 5.11 MIL/uL (ref 3.87–5.11)
RDW: 13.7 % (ref 11.5–15.5)
WBC: 5.4 10*3/uL (ref 4.0–10.5)
nRBC: 0 % (ref 0.0–0.2)

## 2019-02-13 LAB — COMPREHENSIVE METABOLIC PANEL
ALT: 15 U/L (ref 0–44)
AST: 19 U/L (ref 15–41)
Albumin: 2.8 g/dL — ABNORMAL LOW (ref 3.5–5.0)
Alkaline Phosphatase: 59 U/L (ref 38–126)
Anion gap: 12 (ref 5–15)
BUN: 32 mg/dL — ABNORMAL HIGH (ref 8–23)
CO2: 25 mmol/L (ref 22–32)
Calcium: 8.7 mg/dL — ABNORMAL LOW (ref 8.9–10.3)
Chloride: 100 mmol/L (ref 98–111)
Creatinine, Ser: 1.13 mg/dL — ABNORMAL HIGH (ref 0.44–1.00)
GFR calc Af Amer: 57 mL/min — ABNORMAL LOW (ref >60)
GFR calc non Af Amer: 50 mL/min — ABNORMAL LOW (ref >60)
Glucose, Bld: 428 mg/dL — ABNORMAL HIGH (ref 70–99)
Potassium: 4.1 mmol/L (ref 3.5–5.1)
Sodium: 137 mmol/L (ref 135–145)
Total Bilirubin: 0.4 mg/dL (ref 0.3–1.2)
Total Protein: 6.7 g/dL (ref 6.5–8.1)

## 2019-02-13 LAB — C-REACTIVE PROTEIN: CRP: 9.9 mg/dL — ABNORMAL HIGH (ref <1.0)

## 2019-02-13 LAB — GLUCOSE, CAPILLARY
Glucose-Capillary: 328 mg/dL — ABNORMAL HIGH (ref 70–99)
Glucose-Capillary: 364 mg/dL — ABNORMAL HIGH (ref 70–99)
Glucose-Capillary: 308 mg/dL — ABNORMAL HIGH (ref 70–99)
Glucose-Capillary: 259 mg/dL — ABNORMAL HIGH (ref 70–99)

## 2019-02-13 LAB — D-DIMER, QUANTITATIVE: D-Dimer, Quant: 0.41 ug/mL-FEU (ref 0.00–0.50)

## 2019-02-13 NOTE — TOC Progression Note (Signed)
Transition of Care [facility]) - Progression Note    Patient Details  Name: Carrie Solis MRN: [id number] Date of Birth: 1951/01/24  [facility] CM/SW Contact  Purcell Mouton, RN Phone Number: 02/13/2019, 12:03 PM  Clinical Narrative:    Alvis Lemmings will follow pt at discharge for [facility]. In house rep aware.         Expected Discharge Plan and Services                                                 Social Determinants of Health (SDOH) Interventions    Readmission Risk Interventions No flowsheet data found.

## 2019-02-13 NOTE — Progress Notes (Signed)
Physical Therapy Treatment Patient Details Name: Carrie Solis MRN: [id number] DOB: 08-05-1950 Today's Date: 02/13/2019    History of Present Illness 69 y.o. female with medical history significant of DM2, HTN, OSA.  Patient presents to the ED for evaluation of generalized weakness, body aches, chills and diagnosed with COVID.    PT Comments    Progressing slowly with mobility.    Follow Up Recommendations  Home health PT;Supervision/Assistance - 24 hour     Equipment Recommendations       Recommendations for Other Services       Precautions / Restrictions Precautions Precautions: None Restrictions Weight Bearing Restrictions: No    Mobility  Bed Mobility               General bed mobility comments: oob in recliner  Transfers Overall transfer level: Needs assistance Equipment used: None Transfers: Sit to/from Stand Sit to Stand: Min guard         General transfer comment: Close guard for safety. Increased time.  Ambulation/Gait Ambulation/Gait assistance: Min assist Gait Distance (Feet): 60 Feet Assistive device: None("furniture walking") Gait Pattern/deviations: Decreased stride length;Step-through pattern     General Gait Details: slow, unsteady gait. O2 95% on RA.   Stairs             Wheelchair Mobility    Modified Rankin (Stroke Patients Only)       Balance Overall balance assessment: Needs assistance           Standing balance-Leahy Scale: Fair                              Cognition Arousal/Alertness: Awake/alert Behavior During Therapy: WFL for tasks assessed/performed Overall Cognitive Status: Within Functional Limits for tasks assessed                                        Exercises      General Comments        Pertinent Vitals/Pain Pain Assessment: Faces Faces Pain Scale: No hurt    Home Living                      Prior Function            PT Goals (current  goals can now be found in the care plan section) Progress towards PT goals: Progressing toward goals    Frequency    Min 3X/week      PT Plan Current plan remains appropriate    Co-evaluation              AM-PAC PT "6 Clicks" Mobility   Outcome Measure  Help needed turning from your back to your side while in a flat bed without using bedrails?: None Help needed moving from lying on your back to sitting on the side of a flat bed without using bedrails?: A Little Help needed moving to and from a bed to a chair (including a wheelchair)?: A Little Help needed standing up from a chair using your arms (e.g., wheelchair or bedside chair)?: A Little Help needed to walk in hospital room?: A Little Help needed climbing 3-5 steps with a railing? : A Little 6 Click Score: 19    End of Session   Activity Tolerance: Patient limited by fatigue Patient left: in chair;with call bell/phone within reach;with chair alarm  set   PT Visit Diagnosis: Muscle weakness (generalized) (M62.81);Difficulty in walking, not elsewhere classified (R26.2)     Time: [id number] PT Time Calculation (min) (ACUTE ONLY): 32 min  Charges:  $Gait Training: 23-37 mins                        Doreatha Massed, PT Acute Rehabilitation

## 2019-02-13 NOTE — Progress Notes (Signed)
PROGRESS NOTE  Carrie Solis  DOB: 08-02-1950  PCP: Lucianne Lei, MD [id number]  DOA: 02/09/2019 Admitted From: Home  LOS: 3 days   Chief Complaint  Patient presents with  . Weakness   Brief narrative: Carrie Solis is a 69 y.o. female with PMH of obesity, HTN, DM2, OSA. Patient presented to the ED on 02/09/2019 with complaint of generalized weakness, body aches, chills. Pt daughter tested positive for COVID a couple of days ago. Patient developed symptoms about 1 week ago which continued to worsen and hence presented to the ED  ED Course: COVID antigen positive, no O2 requirement. Chest x-ray showed subtle bilateral scattered airspace opacities Admitted for generalized weakness.  Subjective: Patient was seen and examined this morning.   Feels better today.  At rest, patient is not on oxygen supplementation today.  Assessment/Plan: COVID infection Acute respiratory failure with hypoxia -Presented with generalized weakness, body aches, chills -Chest imaging -chest x-ray showed subtle bilaterally scattered airspace opacities. -Treatment: IV Decadron, IV Remdesivir for 5 days to complete on 1/7.   -Since patient's blood sugar level is creeping up to 300s, I decided to stop Decadron today. -Supportive care: Vitamin C, Zinc, inhalers, Tylenol, Antitussives - benzonatate, Mucinex -Oxygen - SpO2: 97 % O2 Flow Rate (L/min): 1 L/min on room air -Continue airborne/contact isolation precautions. -Biomarkers trend as below.   Recent Labs  Lab 02/09/19 2130 02/11/19 0441 02/12/19 0308 02/13/19 0304  WBC 7.9 5.1 3.2* 5.4   Recent Labs    02/11/19 0441 02/12/19 0308 02/13/19 0304  DDIMER 0.75* 0.72* 0.41  CRP 17.7* 15.9* 9.9*   Type 2 diabetes mellitus -A1c 9.8 -Previously used to be on Levemir.  But currently on Ozempic once a week on Thursdays, Farxiga. -Not controlled, A1c 9.8.  Continue home meds. -Her blood sugar is running high in 200s after addition of  Decadron.  Insulin dose have been adjusted.  -Currently on Lantus 10 units daily, Premeal NovoLog scheduled as well as sliding scale insulin.   AKI -Creatinine 1.51 at presentation. Unclear if she has underlying CKD. -Creatinine improving, 1.13 today.  Hypertension  -Home meds include valsartan/HCTZ -Currently on hold. Monitor blood pressure and creatinine.  Resume when appropriate.  Hypothyroidism -continue Synthroid.  Mobility: Encourage ambulation. PT evaluation obtained. Home health PT recommended. Diet: Diabetic diet Fluid: Not on IV fluid. Diuretics on hold DVT prophylaxis:  Lovenox Code Status:  Full code Family Communication:  I called and updated patient's son Dr. Cathlean Sauer at [facility] on 1/6 Expected Discharge:  Final dose of remdesivir to be given on 1/7.  Per patient, her husband is also hospitalized with Covid at [place].  No other family support at home. Plan is to discharge patient home tomorrow with home health services.  Consultants:    Procedures:    Antimicrobials: Anti-infectives (From admission, onward)   Start     Dose/Rate Route Frequency Ordered Stop   02/11/19 1000  remdesivir 100 mg in sodium chloride 0.9 % 100 mL IVPB     100 mg 200 mL/hr over 30 Minutes Intravenous Daily 02/10/19 0303 02/15/19 0959   02/10/19 0315  remdesivir 200 mg in sodium chloride 0.9% 250 mL IVPB     200 mg 580 mL/hr over 30 Minutes Intravenous Once 02/10/19 0303 02/10/19 0459        Code Status: Full Code   Diet Order            Diet Carb Modified Fluid consistency: Thin; Room service appropriate? Yes  Diet effective now              Infusions:  . sodium chloride Stopped (02/12/19 1236)  . remdesivir 100 mg in NS 100 mL 100 mg (02/13/19 0857)    Scheduled Meds: . albuterol  2 puff Inhalation Q6H  . enoxaparin (LOVENOX) injection  40 mg Subcutaneous Q24H  . insulin aspart  0-15 Units Subcutaneous TID WC  . insulin aspart  0-5 Units Subcutaneous QHS  .  insulin aspart  4 Units Subcutaneous TID WC  . insulin glargine  10 Units Subcutaneous Daily  . levothyroxine  100 mcg Oral Q0600  . mometasone-formoterol  2 puff Inhalation BID  . pantoprazole  80 mg Oral Q1200  . rosuvastatin  10 mg Oral Daily    PRN meds: sodium chloride, acetaminophen, ondansetron **OR** ondansetron (ZOFRAN) IV   Objective: Vitals:   02/13/19 0533 02/13/19 1322  BP: 132/80 (!) 136/91  Pulse: 75 86  Resp: 18 16  Temp: 97.8 F (36.6 C) (!) 97.4 F (36.3 C)  SpO2: 96% 97%    Intake/Output Summary (Last 24 hours) at 02/13/2019 1348 Last data filed at 02/13/2019 0912 Gross per 24 hour  Intake 610 ml  Output --  Net 610 ml   Filed Weights   02/09/19 1940  Weight: 104.3 kg   Weight change:  Body mass index is 37.12 kg/m.   Physical Exam: General exam: Appears calm and comfortable.  Feels better today.  Obese. Skin: No rashes, lesions or ulcers. HEENT: Atraumatic, normocephalic, supple neck, no obvious bleeding Lungs: Clear to auscultation bilaterally, no crackles, no wheezing. CVS: Regular rate and rhythm, no murmur GI/Abd soft, nontender, nondistended, bowel sound present CNS: Alert, awake, oriented x3.  Able to follow commands. Psychiatry: Mood appropriate Extremities: No pedal edema, no calf tenderness  Data Review: I have personally reviewed the laboratory data and studies available.  Recent Labs  Lab 02/09/19 2130 02/11/19 0441 02/12/19 0308 02/13/19 0304  WBC 7.9 5.1 3.2* 5.4  NEUTROABS 6.5 3.1 2.4 4.3  HGB 16.4* 14.7 14.5 14.4  HCT 52.5* 47.2* 47.0* 46.1*  MCV 91.1 90.6 90.6 90.2  PLT 217 228 195 278   Recent Labs  Lab 02/09/19 2130 02/11/19 0441 02/12/19 0308 02/13/19 0304  NA 141 144 135 137  K 3.9 3.4* 4.3 4.1  CL 100 106 99 100  CO2 28 28 25 25   GLUCOSE 233* 162* 442* 428*  BUN 29* 28* 30* 32*  CREATININE 1.51* 1.14* 1.22* 1.13*  CALCIUM 8.6* 8.5* 8.3* 8.7*    Terrilee Croak, MD  [facility] 02/13/2019

## 2019-02-13 NOTE — Progress Notes (Signed)
Inpatient Diabetes Program Recommendations  AACE/ADA: New Consensus Statement on Inpatient Glycemic Control (2015)  Target Ranges:  Prepandial:   less than 140 mg/dL      Peak postprandial:   less than 180 mg/dL (1-2 hours)      Critically ill patients:  140 - 180 mg/dL   Results for JAQUEL, LEISTER (MRN [id number]) as of 02/13/2019 09:18  Ref. Range 02/12/2019 07:52 02/12/2019 12:10 02/12/2019 16:50 02/12/2019 20:52  Glucose-Capillary Latest Ref Range: 70 - 99 mg/dL 333 (H)  11 units NOVOLOG  355 (H)  15 units NOVOLOG +  10 units LANTUS  295 (H)  8 units NOVOLOG  286 (H)  3 units NOVOLOG    Results for BLUMA, TOBE (MRN [id number]) as of 02/13/2019 09:18  Ref. Range 02/13/2019 08:15  Glucose-Capillary Latest Ref Range: 70 - 99 mg/dL 328 (H)  15 units NOVOLOG +  10 units LANTUS    Home DM Meds: Farxiga 10 mg Daily                             Ozempic 1 mg Qweek  Current Orders: Lantus 10 units Daily                            Novolog Moderate Correction Scale/ SSI (0-15 units) TID AC + HS      Novolog 4 units TID with meals     Was Getting Decadron 6 mg Daily--Decadron now stopped.  Home Farxiga and Ozempic meds for diabetes on hold.   MD- Note Lantus started yesterday AM and Novolog Meal Coverage started yesterday as well.  Patient did not get Novolog Meal Coverage for 12pm and 5pm yesterday b/c she was not eating well at those meal times.  Did get meal coverage this AM.  Note that Decadron stopped but CBG was 328 this AM.  May consider increasing Lantus to 15 units Daily--Please place order for Lantus 5 units X 1 dose today as well since pt already received 10 units Lantus this AM    --Will follow patient during hospitalization--  Wyn Quaker RN, MSN, CDE Diabetes Coordinator Inpatient Glycemic Control Team Team Pager: [phone] (8a-5p)

## 2019-02-13 NOTE — Care Management Important Message (Signed)
Important Message  Patient Details IM Letter given to Gabriel Earing RN Case Manager to present to the Patient Name: Carrie Solis MRN: [id number] Date of Birth: [birth year]   Medicare Important Message Given:  Yes     Kerin Salen 02/13/2019, 12:41 PM

## 2019-02-14 LAB — CBC WITH DIFFERENTIAL/PLATELET
Abs Immature Granulocytes: 0.02 10*3/uL (ref 0.00–0.07)
Basophils Absolute: 0 10*3/uL (ref 0.0–0.1)
Basophils Relative: 0 %
Eosinophils Absolute: 0 10*3/uL (ref 0.0–0.5)
Eosinophils Relative: 1 %
HCT: 45.5 % (ref 36.0–46.0)
Hemoglobin: 14.3 g/dL (ref 12.0–15.0)
Immature Granulocytes: 0 %
Lymphocytes Relative: 31 %
Lymphs Abs: 1.9 10*3/uL (ref 0.7–4.0)
MCH: 28.3 pg (ref 26.0–34.0)
MCHC: 31.4 g/dL (ref 30.0–36.0)
MCV: 90.1 fL (ref 80.0–100.0)
Monocytes Absolute: 0.5 10*3/uL (ref 0.1–1.0)
Monocytes Relative: 8 %
Neutro Abs: 3.6 10*3/uL (ref 1.7–7.7)
Neutrophils Relative %: 60 %
Platelets: 269 10*3/uL (ref 150–400)
RBC: 5.05 MIL/uL (ref 3.87–5.11)
RDW: 13.9 % (ref 11.5–15.5)
WBC: 6 10*3/uL (ref 4.0–10.5)
nRBC: 0 % (ref 0.0–0.2)

## 2019-02-14 LAB — COMPREHENSIVE METABOLIC PANEL
ALT: 15 U/L (ref 0–44)
AST: 22 U/L (ref 15–41)
Albumin: 2.7 g/dL — ABNORMAL LOW (ref 3.5–5.0)
Alkaline Phosphatase: 50 U/L (ref 38–126)
Anion gap: 12 (ref 5–15)
BUN: 24 mg/dL — ABNORMAL HIGH (ref 8–23)
CO2: 26 mmol/L (ref 22–32)
Calcium: 8.3 mg/dL — ABNORMAL LOW (ref 8.9–10.3)
Chloride: 104 mmol/L (ref 98–111)
Creatinine, Ser: 0.8 mg/dL (ref 0.44–1.00)
GFR calc Af Amer: >60 mL/min (ref >60)
GFR calc non Af Amer: >60 mL/min (ref >60)
Glucose, Bld: 85 mg/dL (ref 70–99)
Potassium: 3.3 mmol/L — ABNORMAL LOW (ref 3.5–5.1)
Sodium: 142 mmol/L (ref 135–145)
Total Bilirubin: 0.6 mg/dL (ref 0.3–1.2)
Total Protein: 6.1 g/dL — ABNORMAL LOW (ref 6.5–8.1)

## 2019-02-14 LAB — GLUCOSE, CAPILLARY
Glucose-Capillary: 94 mg/dL (ref 70–99)
Glucose-Capillary: 194 mg/dL — ABNORMAL HIGH (ref 70–99)

## 2019-02-14 LAB — D-DIMER, QUANTITATIVE: D-Dimer, Quant: 0.41 ug/mL-FEU (ref 0.00–0.50)

## 2019-02-14 LAB — C-REACTIVE PROTEIN: CRP: 5.6 mg/dL — ABNORMAL HIGH (ref <1.0)

## 2019-02-14 MED ORDER — POTASSIUM CHLORIDE CRYS ER 20 MEQ PO TBCR
40.0000 meq | EXTENDED_RELEASE_TABLET | Freq: Once | ORAL | Status: AC
  Administered 2019-02-14: 40 meq via ORAL
  Filled 2019-02-14: qty 2

## 2019-02-14 NOTE — Discharge Summary (Signed)
Physician Discharge Summary  Carrie Solis [id number] DOB: Oct 13, 1950 DOA: 02/09/2019  PCP: Lucianne Lei, MD  Admit date: 02/09/2019 Discharge date: 02/14/2019  Admitted From: Home Discharge disposition: Home with home health RN PT   Code Status: Full Code  Diet Recommendation: Cardiac/diabetic diet   Recommendations for Outpatient Follow-Up:   1. Follow-up with PCP as an outpatient  Discharge Diagnosis:   Principal Problem:   COVID-19 virus infection Active Problems:   Hypothyroidism   Essential hypertension, benign   Diabetes mellitus type 2 in obese (HCC)   CKD (chronic kidney disease) stage 3, GFR 30-59 ml/min   Pneumonia due to COVID-19 virus    History of Present Illness / Brief narrative:  Carrie Solis is a 69 y.o. female with PMH of obesity, HTN, DM2, OSA. Patient presented to the ED on 02/09/2019 with complaint of generalized weakness, body aches, chills. Pt daughter tested positive for COVID a couple of days ago. Patient developed symptoms about 1 week ago which continued to worsen and hence presented to the ED  ED Course:COVID antigen positive, no O2 requirement. Chest x-ray showed subtle bilateral scattered airspace opacities Admitted for generalized weakness.  Hospital Course:  COVID infection Acute respiratory failure with hypoxia -Presented with generalized weakness, body aches, chills -Chest imaging -chest x-ray showed subtle bilaterally scattered airspace opacities. -Treatment: Treated with IV Decadron, IV Remdesivir for 5 days completed on 1/7.   -Supportive care: Vitamin C, Zinc, inhalers, Tylenol, Antitussives - benzonatate, Mucinex -Oxygen - patient is not requiring oxygen supplementation at rest but on ambulation, her saturation dropped down to 80s.  Qualified for home oxygen.  Arranged. -Continue airborne/contact isolation precautions. -Biomarkers trend as below.   Recent Labs  Lab 02/09/19 2130 02/11/19 0441 02/12/19 0308  02/13/19 0304 02/14/19 0332  WBC 7.9 5.1 3.2* 5.4 6.0   Recent Labs    02/12/19 0308 02/13/19 0304 02/14/19 0332  DDIMER 0.72* 0.41 0.41  CRP 15.9* 9.9* 5.6*    Type 2 diabetes mellitus -A1c 9.8 -Previously used to be on Levemir.  But currently on Ozempic once a week on Thursdays, Farxiga. -Not controlled, A1c 9.8.   -In the hospital, because of use of Decadron, her blood sugar level is creeping up.  Decadron course has completed.  Patient has been restarted back on her home regimen.  She does not want to be started on insulin.  I recommended her to follow-up with primary care provider because she probably needs stricter control of blood sugar because A1c is still elevated at 9.8.    AKI -Creatinine 1.51 at presentation. Unclear if she has underlying CKD. -Creatinine improved to normal.  Hypertension  -Home meds include valsartan/HCTZ -Resume post discharge.  Hypothyroidism -continue Synthroid.  Okay to discharge home today. Home health care, RN, PT arranged.  Subjective:  Seen and examined this morning.  Not in distress.  Excited to go home.  Not on oxygen supplementation at rest.  Discharge Exam:   Vitals:   02/13/19 2049 02/14/19 0433 02/14/19 0830 02/14/19 1224  BP: 129/77 124/74  140/86  Pulse: 93 (!) 103 (!) 113 98  Resp: 18 20 16 19   Temp: 97.7 F (36.5 C) 98.5 F (36.9 C)  97.8 F (36.6 C)  TempSrc: Oral Oral  Oral  SpO2: 95% 93% 97% 96%  Weight:      Height:        Body mass index is 37.12 kg/m.  General exam: Appears calm and comfortable.  Skin: No rashes, lesions or ulcers.  HEENT: Atraumatic, normocephalic, supple neck, no obvious bleeding Lungs: Clear to auscultation bilaterally CVS: Regular rate and rhythm, no murmur GI/Abd soft, nontender, nondistended, bowel sound present CNS: Alert, awake monitor x3 Psychiatry: Mood appropriate Extremities: No pedal edema, no calf tenderness  Discharge Instructions:  Wound care: None Discharge  Instructions    Diet - low sodium heart healthy   Complete by: As directed    Diet Carb Modified   Complete by: As directed    Increase activity slowly   Complete by: As directed      Follow-up Information    Schedule an appointment as soon as possible for a visit  with Lucianne Lei, MD.   Specialty: Family Medicine Contact information: [address] [place] [address] [phone]        Go to  [facility].   Specialty: Emergency Medicine Why: As needed, If symptoms worsen Contact information: [address] [id number] [place], [facility] Follow up.   Specialty: Home Health Services Why: Will call you within 48hours after discharge to arrange home visits. Feel Free to call them.  Contact information: [address] [place] [address] [phone]        [facility], [facility] Follow up.   Specialty: Home Health Services Why: [facility] will call you. Please feel free to call [facility].  Contact information: [place] [id number] [place]., [facility] Follow up.   Why: Home Oxygen Contact information: [place] [address] [phone]          Allergies as of 02/14/2019      Reactions   Sulfonamide Derivatives    REACTION: rash      Medication List    TAKE these medications   budesonide-formoterol 160-4.5 MCG/ACT inhaler Commonly known as: SYMBICORT Inhale 2 puffs into the lungs daily as needed (congestion).   cetirizine 10 MG tablet Commonly known as: ZYRTEC Take 10 mg by mouth daily.   esomeprazole 40 MG capsule Commonly known as: NEXIUM Take 40 mg by mouth daily.   Farxiga 10 MG Tabs tablet Generic drug: dapagliflozin propanediol Take 10 mg by mouth daily.   FreeStyle Libre 14 Day Sensor Misc Inject 1 patch into the skin every 14 (fourteen) days.    glucose blood test strip Commonly known as: FREESTYLE LITE Two times a day dx 250.01   levothyroxine 100 MCG tablet Commonly known as: SYNTHROID Take 100 mcg by mouth daily.   montelukast 10 MG tablet Commonly known as: SINGULAIR Take 10 mg by mouth daily as needed (allergies).   Ozempic (1 MG/DOSE) 2 MG/1.5ML Sopn Generic drug: Semaglutide (1 MG/DOSE) Inject 1 mg into the skin once a week. Thursday   Proventil 90 MCG/ACT inhaler Generic drug: albuterol Inhale 2 puffs into the lungs as needed for shortness of breath.   rosuvastatin 10 MG tablet Commonly known as: CRESTOR Take 10 mg by mouth daily.   valsartan-hydrochlorothiazide 160-12.5 MG tablet Commonly known as: DIOVAN-HCT Take 1 tablet by mouth daily.            Durable Medical Equipment  (From admission, onward)         Start     Ordered   02/14/19 1150  DME Oxygen  Once    Question Answer Comment  Length of Need 6 Months  Mode or (Route) Nasal cannula   Liters per Minute 2   Frequency Continuous (stationary and portable oxygen unit needed)   Oxygen conserving device Yes   Oxygen delivery system Gas      02/14/19 1150          Time coordinating discharge: 35 minutes  The results of significant diagnostics from this hospitalization (including imaging, microbiology, ancillary and laboratory) are listed below for reference.    Procedures and Diagnostic Studies:   DG Chest Port 1 View  Result Date: 02/09/2019 CLINICAL DATA:  Cough. EXAM: PORTABLE CHEST 1 VIEW COMPARISON:  September 04, 2003 FINDINGS: There are few subtle scattered airspace opacities bilaterally. There is no pneumothorax or large pleural effusion. The heart size is normal. Aortic calcifications are noted. There are degenerative changes of the left glenohumeral joint. IMPRESSION: Subtle scattered airspace opacities which can be seen in patients with viral pneumonia. Electronically Signed   By: Constance Holster M.D.   On: 02/09/2019 20:26      Labs:   Basic Metabolic Panel: Recent Labs  Lab 02/09/19 2130 02/11/19 0441 02/12/19 0308 02/13/19 0304 02/14/19 0332  NA 141 144 135 137 142  K 3.9 3.4* 4.3 4.1 3.3*  CL 100 106 99 100 104  CO2 28 28 25 25 26   GLUCOSE 233* 162* 442* 428* 85  BUN 29* 28* 30* 32* 24*  CREATININE 1.51* 1.14* 1.22* 1.13* 0.80  CALCIUM 8.6* 8.5* 8.3* 8.7* 8.3*   GFR Estimated Creatinine Clearance: 81 mL/min (by C-G formula based on SCr of 0.8 mg/dL). Liver Function Tests: Recent Labs  Lab 02/09/19 2130 02/11/19 0441 02/12/19 0308 02/13/19 0304 02/14/19 0332  AST 34 25 24 19 22   ALT 20 16 15 15 15   ALKPHOS 67 52 56 59 50  BILITOT 0.7 0.5 1.0 0.4 0.6  PROT 7.5 6.3* 6.3* 6.7 6.1*  ALBUMIN 3.1* 2.7* 2.7* 2.8* 2.7*   No results for input(s): LIPASE, AMYLASE in the last 168 hours. No results for input(s): AMMONIA in the last 168 hours. Coagulation profile No results for input(s): INR, PROTIME in the last 168 hours.  CBC: Recent Labs  Lab 02/09/19 2130 02/11/19 0441 02/12/19 0308 02/13/19 0304 02/14/19 0332  WBC 7.9 5.1 3.2* 5.4 6.0  NEUTROABS 6.5 3.1 2.4 4.3 3.6  HGB 16.4* 14.7 14.5 14.4 14.3  HCT 52.5* 47.2* 47.0* 46.1* 45.5  MCV 91.1 90.6 90.6 90.2 90.1  PLT 217 228 195 278 269   Cardiac Enzymes: No results for input(s): CKTOTAL, CKMB, CKMBINDEX, TROPONINI in the last 168 hours. BNP: Invalid input(s): POCBNP CBG: Recent Labs  Lab 02/13/19 1233 02/13/19 1717 02/13/19 2101 02/14/19 0751 02/14/19 1154  GLUCAP 364* 308* 259* 94 194*   D-Dimer Recent Labs    02/13/19 0304 02/14/19 0332  DDIMER 0.41 0.41   Hgb A1c No results for input(s): HGBA1C in the last 72 hours. Lipid Profile No results for input(s): CHOL, HDL, LDLCALC, TRIG, CHOLHDL, LDLDIRECT in the last 72 hours. Thyroid function studies No results for input(s): TSH, T4TOTAL, T3FREE, THYROIDAB in the last 72 hours.  Invalid input(s): FREET3 Anemia work up No results for input(s): VITAMINB12,  FOLATE, FERRITIN, TIBC, IRON, RETICCTPCT in the last 72 hours. Microbiology No results found for this or any previous visit (from the past 240 hour(s)).  Please note: You were cared for by a hospitalist during your hospital stay. Once you are discharged, your primary care physician will handle any further medical issues. Please note that NO REFILLS for any discharge  medications will be authorized once you are discharged, as it is imperative that you return to your primary care physician (or establish a relationship with a primary care physician if you do not have one) for your post hospital discharge needs so that they can reassess your need for medications and monitor your lab values.  Signed: Terrilee Croak  [facility] 02/14/2019, 4:59 PM

## 2019-02-14 NOTE — Progress Notes (Signed)
Pt's O2 sats on RA at rest: 96% When ambulating, pt's O2 sats drop to 83-84%, requiring 2L O2 [place]. MD aware at this time.

## 2019-02-14 NOTE — TOC Progression Note (Signed)
Transition of Care [facility]) - Progression Note    Patient Details  Name: Carrie Solis MRN: [id number] Date of Birth: April 05, 1950  Transition of [facility]) CM/SW Contact  Purcell Mouton, RN Phone Number: 02/14/2019, 12:40 PM  Clinical Narrative:    Pt selected [facility] for Home O2, referral given to in house rep.         Expected Discharge Plan and Services           Expected Discharge Date: 02/14/19                                     Social Determinants of Health (SDOH) Interventions    Readmission Risk Interventions No flowsheet data found.

## 2019-02-14 NOTE — Progress Notes (Signed)
SATURATION QUALIFICATIONS: (This note is used to comply with regulatory documentation for home oxygen)  Patient Saturations on Room Air at Rest = 96%  Patient Saturations on Room Air while Ambulating = 83-84%  Patient Saturations on 2 Liters of oxygen while Ambulating = 91%  Please briefly explain why patient needs home oxygen: The pt requires home oxygen because her oxygen saturations drop <88% when ambulating.

## 2019-02-15 NOTE — TOC Progression Note (Signed)
Transition of Care [facility]) - Progression Note    Patient Details  Name: TIANNAH PAGANINI MRN: [id number] Date of Birth: 1950/03/28  Transition of Care [facility]) CM/SW Contact  Purcell Mouton, RN Phone Number: 02/15/2019, 2:37 PM  Clinical Narrative:    Daughter called to ask for pt to be setup for [place] with [facility]. A call to [facility] [phone] revealed that pt or daughter needed to call to setup meals up. Telephone number [phone] and information given to pt's daughter Claiborne Billings.        Expected Discharge Plan and Services           Expected Discharge Date: 02/14/19                                     Social Determinants of Health (SDOH) Interventions    Readmission Risk Interventions No flowsheet data found.

## 2019-02-17 DIAGNOSIS — M170 Bilateral primary osteoarthritis of knee: Secondary | ICD-10-CM | POA: Diagnosis not present

## 2019-02-17 DIAGNOSIS — J45909 Unspecified asthma, uncomplicated: Secondary | ICD-10-CM | POA: Diagnosis not present

## 2019-02-17 DIAGNOSIS — J1282 Pneumonia due to coronavirus disease 2019: Secondary | ICD-10-CM | POA: Diagnosis not present

## 2019-02-17 DIAGNOSIS — I700 Atherosclerosis of aorta: Secondary | ICD-10-CM | POA: Diagnosis not present

## 2019-02-17 DIAGNOSIS — I129 Hypertensive chronic kidney disease with stage 1 through stage 4 chronic kidney disease, or unspecified chronic kidney disease: Secondary | ICD-10-CM | POA: Diagnosis not present

## 2019-02-17 DIAGNOSIS — E1122 Type 2 diabetes mellitus with diabetic chronic kidney disease: Secondary | ICD-10-CM | POA: Diagnosis not present

## 2019-02-17 DIAGNOSIS — N183 Chronic kidney disease, stage 3 unspecified: Secondary | ICD-10-CM | POA: Diagnosis not present

## 2019-02-17 DIAGNOSIS — J9601 Acute respiratory failure with hypoxia: Secondary | ICD-10-CM | POA: Diagnosis not present

## 2019-02-17 DIAGNOSIS — U071 COVID-19: Secondary | ICD-10-CM | POA: Diagnosis not present

## 2019-02-20 DIAGNOSIS — J1282 Pneumonia due to coronavirus disease 2019: Secondary | ICD-10-CM | POA: Diagnosis not present

## 2019-02-20 DIAGNOSIS — M170 Bilateral primary osteoarthritis of knee: Secondary | ICD-10-CM | POA: Diagnosis not present

## 2019-02-20 DIAGNOSIS — E1122 Type 2 diabetes mellitus with diabetic chronic kidney disease: Secondary | ICD-10-CM | POA: Diagnosis not present

## 2019-02-20 DIAGNOSIS — J45909 Unspecified asthma, uncomplicated: Secondary | ICD-10-CM | POA: Diagnosis not present

## 2019-02-20 DIAGNOSIS — U071 COVID-19: Secondary | ICD-10-CM | POA: Diagnosis not present

## 2019-02-20 DIAGNOSIS — J9601 Acute respiratory failure with hypoxia: Secondary | ICD-10-CM | POA: Diagnosis not present

## 2019-02-20 DIAGNOSIS — I129 Hypertensive chronic kidney disease with stage 1 through stage 4 chronic kidney disease, or unspecified chronic kidney disease: Secondary | ICD-10-CM | POA: Diagnosis not present

## 2019-02-20 DIAGNOSIS — N183 Chronic kidney disease, stage 3 unspecified: Secondary | ICD-10-CM | POA: Diagnosis not present

## 2019-02-20 DIAGNOSIS — I700 Atherosclerosis of aorta: Secondary | ICD-10-CM | POA: Diagnosis not present

## 2019-02-22 DIAGNOSIS — I129 Hypertensive chronic kidney disease with stage 1 through stage 4 chronic kidney disease, or unspecified chronic kidney disease: Secondary | ICD-10-CM | POA: Diagnosis not present

## 2019-02-22 DIAGNOSIS — J45909 Unspecified asthma, uncomplicated: Secondary | ICD-10-CM | POA: Diagnosis not present

## 2019-02-22 DIAGNOSIS — J9601 Acute respiratory failure with hypoxia: Secondary | ICD-10-CM | POA: Diagnosis not present

## 2019-02-22 DIAGNOSIS — U071 COVID-19: Secondary | ICD-10-CM | POA: Diagnosis not present

## 2019-02-22 DIAGNOSIS — J1282 Pneumonia due to coronavirus disease 2019: Secondary | ICD-10-CM | POA: Diagnosis not present

## 2019-02-22 DIAGNOSIS — E1122 Type 2 diabetes mellitus with diabetic chronic kidney disease: Secondary | ICD-10-CM | POA: Diagnosis not present

## 2019-02-22 DIAGNOSIS — M170 Bilateral primary osteoarthritis of knee: Secondary | ICD-10-CM | POA: Diagnosis not present

## 2019-02-22 DIAGNOSIS — N183 Chronic kidney disease, stage 3 unspecified: Secondary | ICD-10-CM | POA: Diagnosis not present

## 2019-02-22 DIAGNOSIS — I700 Atherosclerosis of aorta: Secondary | ICD-10-CM | POA: Diagnosis not present

## 2019-02-26 DIAGNOSIS — J45909 Unspecified asthma, uncomplicated: Secondary | ICD-10-CM | POA: Diagnosis not present

## 2019-02-26 DIAGNOSIS — U071 COVID-19: Secondary | ICD-10-CM | POA: Diagnosis not present

## 2019-02-26 DIAGNOSIS — J1282 Pneumonia due to coronavirus disease 2019: Secondary | ICD-10-CM | POA: Diagnosis not present

## 2019-02-26 DIAGNOSIS — M170 Bilateral primary osteoarthritis of knee: Secondary | ICD-10-CM | POA: Diagnosis not present

## 2019-02-26 DIAGNOSIS — I129 Hypertensive chronic kidney disease with stage 1 through stage 4 chronic kidney disease, or unspecified chronic kidney disease: Secondary | ICD-10-CM | POA: Diagnosis not present

## 2019-02-26 DIAGNOSIS — J9601 Acute respiratory failure with hypoxia: Secondary | ICD-10-CM | POA: Diagnosis not present

## 2019-02-26 DIAGNOSIS — N183 Chronic kidney disease, stage 3 unspecified: Secondary | ICD-10-CM | POA: Diagnosis not present

## 2019-02-26 DIAGNOSIS — I700 Atherosclerosis of aorta: Secondary | ICD-10-CM | POA: Diagnosis not present

## 2019-02-26 DIAGNOSIS — E1122 Type 2 diabetes mellitus with diabetic chronic kidney disease: Secondary | ICD-10-CM | POA: Diagnosis not present

## 2019-02-28 DIAGNOSIS — N183 Chronic kidney disease, stage 3 unspecified: Secondary | ICD-10-CM | POA: Diagnosis not present

## 2019-02-28 DIAGNOSIS — J45909 Unspecified asthma, uncomplicated: Secondary | ICD-10-CM | POA: Diagnosis not present

## 2019-02-28 DIAGNOSIS — M170 Bilateral primary osteoarthritis of knee: Secondary | ICD-10-CM | POA: Diagnosis not present

## 2019-02-28 DIAGNOSIS — I700 Atherosclerosis of aorta: Secondary | ICD-10-CM | POA: Diagnosis not present

## 2019-02-28 DIAGNOSIS — E1122 Type 2 diabetes mellitus with diabetic chronic kidney disease: Secondary | ICD-10-CM | POA: Diagnosis not present

## 2019-02-28 DIAGNOSIS — I129 Hypertensive chronic kidney disease with stage 1 through stage 4 chronic kidney disease, or unspecified chronic kidney disease: Secondary | ICD-10-CM | POA: Diagnosis not present

## 2019-02-28 DIAGNOSIS — U071 COVID-19: Secondary | ICD-10-CM | POA: Diagnosis not present

## 2019-02-28 DIAGNOSIS — J1282 Pneumonia due to coronavirus disease 2019: Secondary | ICD-10-CM | POA: Diagnosis not present

## 2019-02-28 DIAGNOSIS — J9601 Acute respiratory failure with hypoxia: Secondary | ICD-10-CM | POA: Diagnosis not present

## 2019-03-05 DIAGNOSIS — J1282 Pneumonia due to coronavirus disease 2019: Secondary | ICD-10-CM | POA: Diagnosis not present

## 2019-03-05 DIAGNOSIS — N183 Chronic kidney disease, stage 3 unspecified: Secondary | ICD-10-CM | POA: Diagnosis not present

## 2019-03-05 DIAGNOSIS — U071 COVID-19: Secondary | ICD-10-CM | POA: Diagnosis not present

## 2019-03-05 DIAGNOSIS — J9601 Acute respiratory failure with hypoxia: Secondary | ICD-10-CM | POA: Diagnosis not present

## 2019-03-05 DIAGNOSIS — I129 Hypertensive chronic kidney disease with stage 1 through stage 4 chronic kidney disease, or unspecified chronic kidney disease: Secondary | ICD-10-CM | POA: Diagnosis not present

## 2019-03-05 DIAGNOSIS — I700 Atherosclerosis of aorta: Secondary | ICD-10-CM | POA: Diagnosis not present

## 2019-03-05 DIAGNOSIS — J45909 Unspecified asthma, uncomplicated: Secondary | ICD-10-CM | POA: Diagnosis not present

## 2019-03-05 DIAGNOSIS — M170 Bilateral primary osteoarthritis of knee: Secondary | ICD-10-CM | POA: Diagnosis not present

## 2019-03-05 DIAGNOSIS — E1122 Type 2 diabetes mellitus with diabetic chronic kidney disease: Secondary | ICD-10-CM | POA: Diagnosis not present

## 2019-03-15 DIAGNOSIS — M170 Bilateral primary osteoarthritis of knee: Secondary | ICD-10-CM | POA: Diagnosis not present

## 2019-03-15 DIAGNOSIS — N183 Chronic kidney disease, stage 3 unspecified: Secondary | ICD-10-CM | POA: Diagnosis not present

## 2019-03-15 DIAGNOSIS — J1282 Pneumonia due to coronavirus disease 2019: Secondary | ICD-10-CM | POA: Diagnosis not present

## 2019-03-15 DIAGNOSIS — E1122 Type 2 diabetes mellitus with diabetic chronic kidney disease: Secondary | ICD-10-CM | POA: Diagnosis not present

## 2019-03-15 DIAGNOSIS — J9601 Acute respiratory failure with hypoxia: Secondary | ICD-10-CM | POA: Diagnosis not present

## 2019-03-15 DIAGNOSIS — I129 Hypertensive chronic kidney disease with stage 1 through stage 4 chronic kidney disease, or unspecified chronic kidney disease: Secondary | ICD-10-CM | POA: Diagnosis not present

## 2019-03-15 DIAGNOSIS — U071 COVID-19: Secondary | ICD-10-CM | POA: Diagnosis not present

## 2019-03-15 DIAGNOSIS — J45909 Unspecified asthma, uncomplicated: Secondary | ICD-10-CM | POA: Diagnosis not present

## 2019-03-15 DIAGNOSIS — I700 Atherosclerosis of aorta: Secondary | ICD-10-CM | POA: Diagnosis not present

## 2019-03-19 DIAGNOSIS — E039 Hypothyroidism, unspecified: Secondary | ICD-10-CM | POA: Diagnosis not present

## 2019-03-19 DIAGNOSIS — K219 Gastro-esophageal reflux disease without esophagitis: Secondary | ICD-10-CM | POA: Diagnosis not present

## 2019-03-19 DIAGNOSIS — J45909 Unspecified asthma, uncomplicated: Secondary | ICD-10-CM | POA: Diagnosis not present

## 2019-03-19 DIAGNOSIS — J4540 Moderate persistent asthma, uncomplicated: Secondary | ICD-10-CM | POA: Diagnosis not present

## 2019-03-19 DIAGNOSIS — M170 Bilateral primary osteoarthritis of knee: Secondary | ICD-10-CM | POA: Diagnosis not present

## 2019-03-19 DIAGNOSIS — J9601 Acute respiratory failure with hypoxia: Secondary | ICD-10-CM | POA: Diagnosis not present

## 2019-03-19 DIAGNOSIS — I10 Essential (primary) hypertension: Secondary | ICD-10-CM | POA: Diagnosis not present

## 2019-03-19 DIAGNOSIS — N183 Chronic kidney disease, stage 3 unspecified: Secondary | ICD-10-CM | POA: Diagnosis not present

## 2019-03-19 DIAGNOSIS — G4733 Obstructive sleep apnea (adult) (pediatric): Secondary | ICD-10-CM | POA: Diagnosis not present

## 2019-03-19 DIAGNOSIS — I129 Hypertensive chronic kidney disease with stage 1 through stage 4 chronic kidney disease, or unspecified chronic kidney disease: Secondary | ICD-10-CM | POA: Diagnosis not present

## 2019-03-19 DIAGNOSIS — U071 COVID-19: Secondary | ICD-10-CM | POA: Diagnosis not present

## 2019-03-19 DIAGNOSIS — Z23 Encounter for immunization: Secondary | ICD-10-CM | POA: Diagnosis not present

## 2019-03-19 DIAGNOSIS — I700 Atherosclerosis of aorta: Secondary | ICD-10-CM | POA: Diagnosis not present

## 2019-03-19 DIAGNOSIS — J1282 Pneumonia due to coronavirus disease 2019: Secondary | ICD-10-CM | POA: Diagnosis not present

## 2019-03-19 DIAGNOSIS — E1122 Type 2 diabetes mellitus with diabetic chronic kidney disease: Secondary | ICD-10-CM | POA: Diagnosis not present

## 2019-03-20 DIAGNOSIS — U071 COVID-19: Secondary | ICD-10-CM | POA: Diagnosis not present

## 2019-03-20 DIAGNOSIS — E1122 Type 2 diabetes mellitus with diabetic chronic kidney disease: Secondary | ICD-10-CM | POA: Diagnosis not present

## 2019-03-20 DIAGNOSIS — J9601 Acute respiratory failure with hypoxia: Secondary | ICD-10-CM | POA: Diagnosis not present

## 2019-03-20 DIAGNOSIS — J45909 Unspecified asthma, uncomplicated: Secondary | ICD-10-CM | POA: Diagnosis not present

## 2019-03-20 DIAGNOSIS — J1282 Pneumonia due to coronavirus disease 2019: Secondary | ICD-10-CM | POA: Diagnosis not present

## 2019-03-20 DIAGNOSIS — M170 Bilateral primary osteoarthritis of knee: Secondary | ICD-10-CM | POA: Diagnosis not present

## 2019-03-20 DIAGNOSIS — N183 Chronic kidney disease, stage 3 unspecified: Secondary | ICD-10-CM | POA: Diagnosis not present

## 2019-03-20 DIAGNOSIS — I700 Atherosclerosis of aorta: Secondary | ICD-10-CM | POA: Diagnosis not present

## 2019-03-20 DIAGNOSIS — I129 Hypertensive chronic kidney disease with stage 1 through stage 4 chronic kidney disease, or unspecified chronic kidney disease: Secondary | ICD-10-CM | POA: Diagnosis not present

## 2019-03-21 ENCOUNTER — Telehealth: Payer: Self-pay | Admitting: Family Medicine

## 2019-03-29 DIAGNOSIS — J1282 Pneumonia due to coronavirus disease 2019: Secondary | ICD-10-CM | POA: Diagnosis not present

## 2019-03-29 DIAGNOSIS — U071 COVID-19: Secondary | ICD-10-CM | POA: Diagnosis not present

## 2019-03-29 DIAGNOSIS — I700 Atherosclerosis of aorta: Secondary | ICD-10-CM | POA: Diagnosis not present

## 2019-03-29 DIAGNOSIS — M170 Bilateral primary osteoarthritis of knee: Secondary | ICD-10-CM | POA: Diagnosis not present

## 2019-03-29 DIAGNOSIS — I129 Hypertensive chronic kidney disease with stage 1 through stage 4 chronic kidney disease, or unspecified chronic kidney disease: Secondary | ICD-10-CM | POA: Diagnosis not present

## 2019-03-29 DIAGNOSIS — J9601 Acute respiratory failure with hypoxia: Secondary | ICD-10-CM | POA: Diagnosis not present

## 2019-03-29 DIAGNOSIS — J45909 Unspecified asthma, uncomplicated: Secondary | ICD-10-CM | POA: Diagnosis not present

## 2019-03-29 DIAGNOSIS — N183 Chronic kidney disease, stage 3 unspecified: Secondary | ICD-10-CM | POA: Diagnosis not present

## 2019-03-29 DIAGNOSIS — E1122 Type 2 diabetes mellitus with diabetic chronic kidney disease: Secondary | ICD-10-CM | POA: Diagnosis not present

## 2019-04-02 DIAGNOSIS — E1165 Type 2 diabetes mellitus with hyperglycemia: Secondary | ICD-10-CM | POA: Diagnosis not present

## 2019-04-02 DIAGNOSIS — R6889 Other general symptoms and signs: Secondary | ICD-10-CM | POA: Diagnosis not present

## 2019-04-02 DIAGNOSIS — I10 Essential (primary) hypertension: Secondary | ICD-10-CM | POA: Diagnosis not present

## 2019-04-18 DIAGNOSIS — M6281 Muscle weakness (generalized): Secondary | ICD-10-CM | POA: Diagnosis not present

## 2019-04-18 NOTE — Telephone Encounter (Signed)
done

## 2019-04-23 DIAGNOSIS — M6281 Muscle weakness (generalized): Secondary | ICD-10-CM | POA: Diagnosis not present

## 2019-04-26 DIAGNOSIS — M6281 Muscle weakness (generalized): Secondary | ICD-10-CM | POA: Diagnosis not present

## 2019-05-03 DIAGNOSIS — M6281 Muscle weakness (generalized): Secondary | ICD-10-CM | POA: Diagnosis not present

## 2019-05-07 DIAGNOSIS — M6281 Muscle weakness (generalized): Secondary | ICD-10-CM | POA: Diagnosis not present

## 2019-05-14 DIAGNOSIS — M6281 Muscle weakness (generalized): Secondary | ICD-10-CM | POA: Diagnosis not present

## 2019-05-21 DIAGNOSIS — M6281 Muscle weakness (generalized): Secondary | ICD-10-CM | POA: Diagnosis not present

## 2019-05-23 ENCOUNTER — Telehealth: Payer: Self-pay

## 2019-05-29 ENCOUNTER — Ambulatory Visit: Payer: Medicare Other | Admitting: Family Medicine

## 2019-06-04 DIAGNOSIS — M6281 Muscle weakness (generalized): Secondary | ICD-10-CM | POA: Diagnosis not present

## 2019-06-11 NOTE — Telephone Encounter (Signed)
No action required.

## 2019-09-02 DIAGNOSIS — E1165 Type 2 diabetes mellitus with hyperglycemia: Secondary | ICD-10-CM | POA: Diagnosis not present

## 2019-09-02 DIAGNOSIS — I10 Essential (primary) hypertension: Secondary | ICD-10-CM | POA: Diagnosis not present

## 2019-09-02 DIAGNOSIS — R0981 Nasal congestion: Secondary | ICD-10-CM | POA: Diagnosis not present

## 2019-11-06 DIAGNOSIS — H401131 Primary open-angle glaucoma, bilateral, mild stage: Secondary | ICD-10-CM | POA: Diagnosis not present

## 2019-11-06 DIAGNOSIS — H25813 Combined forms of age-related cataract, bilateral: Secondary | ICD-10-CM | POA: Diagnosis not present

## 2019-11-06 DIAGNOSIS — E119 Type 2 diabetes mellitus without complications: Secondary | ICD-10-CM | POA: Diagnosis not present

## 2019-11-06 DIAGNOSIS — H401130 Primary open-angle glaucoma, bilateral, stage unspecified: Secondary | ICD-10-CM | POA: Diagnosis not present

## 2019-11-11 DIAGNOSIS — F32A Depression, unspecified: Secondary | ICD-10-CM | POA: Diagnosis not present

## 2019-11-11 DIAGNOSIS — E039 Hypothyroidism, unspecified: Secondary | ICD-10-CM | POA: Diagnosis not present

## 2019-11-11 DIAGNOSIS — Z79890 Hormone replacement therapy (postmenopausal): Secondary | ICD-10-CM | POA: Diagnosis not present

## 2019-11-11 DIAGNOSIS — E1165 Type 2 diabetes mellitus with hyperglycemia: Secondary | ICD-10-CM | POA: Diagnosis not present

## 2019-11-11 DIAGNOSIS — I10 Essential (primary) hypertension: Secondary | ICD-10-CM | POA: Diagnosis not present

## 2019-11-11 DIAGNOSIS — J4540 Moderate persistent asthma, uncomplicated: Secondary | ICD-10-CM | POA: Diagnosis not present

## 2019-11-11 DIAGNOSIS — Z794 Long term (current) use of insulin: Secondary | ICD-10-CM | POA: Diagnosis not present

## 2019-11-25 DIAGNOSIS — Z1231 Encounter for screening mammogram for malignant neoplasm of breast: Secondary | ICD-10-CM | POA: Diagnosis not present

## 2019-12-09 DIAGNOSIS — H25813 Combined forms of age-related cataract, bilateral: Secondary | ICD-10-CM | POA: Diagnosis not present

## 2019-12-09 DIAGNOSIS — H401130 Primary open-angle glaucoma, bilateral, stage unspecified: Secondary | ICD-10-CM | POA: Diagnosis not present

## 2019-12-09 DIAGNOSIS — E119 Type 2 diabetes mellitus without complications: Secondary | ICD-10-CM | POA: Diagnosis not present

## 2020-01-14 DIAGNOSIS — I10 Essential (primary) hypertension: Secondary | ICD-10-CM | POA: Diagnosis not present

## 2020-01-14 DIAGNOSIS — E1165 Type 2 diabetes mellitus with hyperglycemia: Secondary | ICD-10-CM | POA: Diagnosis not present

## 2020-01-14 DIAGNOSIS — R6889 Other general symptoms and signs: Secondary | ICD-10-CM | POA: Diagnosis not present

## 2020-01-20 DIAGNOSIS — H401131 Primary open-angle glaucoma, bilateral, mild stage: Secondary | ICD-10-CM | POA: Diagnosis not present

## 2020-02-17 DIAGNOSIS — Z03818 Encounter for observation for suspected exposure to other biological agents ruled out: Secondary | ICD-10-CM | POA: Diagnosis not present

## 2020-03-02 DIAGNOSIS — H25813 Combined forms of age-related cataract, bilateral: Secondary | ICD-10-CM | POA: Diagnosis not present

## 2020-03-02 DIAGNOSIS — E119 Type 2 diabetes mellitus without complications: Secondary | ICD-10-CM | POA: Diagnosis not present

## 2020-03-02 DIAGNOSIS — H401130 Primary open-angle glaucoma, bilateral, stage unspecified: Secondary | ICD-10-CM | POA: Diagnosis not present

## 2020-03-11 DIAGNOSIS — Z20822 Contact with and (suspected) exposure to covid-19: Secondary | ICD-10-CM | POA: Diagnosis not present

## 2020-03-11 DIAGNOSIS — Z03818 Encounter for observation for suspected exposure to other biological agents ruled out: Secondary | ICD-10-CM | POA: Diagnosis not present

## 2020-04-15 DIAGNOSIS — Z1331 Encounter for screening for depression: Secondary | ICD-10-CM | POA: Diagnosis not present

## 2020-04-15 DIAGNOSIS — E785 Hyperlipidemia, unspecified: Secondary | ICD-10-CM | POA: Diagnosis not present

## 2020-04-15 DIAGNOSIS — E1140 Type 2 diabetes mellitus with diabetic neuropathy, unspecified: Secondary | ICD-10-CM | POA: Diagnosis not present

## 2020-04-15 DIAGNOSIS — E119 Type 2 diabetes mellitus without complications: Secondary | ICD-10-CM | POA: Diagnosis not present

## 2020-04-16 DIAGNOSIS — E785 Hyperlipidemia, unspecified: Secondary | ICD-10-CM | POA: Diagnosis not present

## 2020-04-16 DIAGNOSIS — E119 Type 2 diabetes mellitus without complications: Secondary | ICD-10-CM | POA: Diagnosis not present

## 2020-05-05 DIAGNOSIS — E1140 Type 2 diabetes mellitus with diabetic neuropathy, unspecified: Secondary | ICD-10-CM | POA: Diagnosis not present

## 2020-05-05 DIAGNOSIS — Z79899 Other long term (current) drug therapy: Secondary | ICD-10-CM | POA: Diagnosis not present

## 2020-06-30 DIAGNOSIS — H401131 Primary open-angle glaucoma, bilateral, mild stage: Secondary | ICD-10-CM | POA: Diagnosis not present

## 2020-06-30 DIAGNOSIS — E119 Type 2 diabetes mellitus without complications: Secondary | ICD-10-CM | POA: Diagnosis not present

## 2020-06-30 DIAGNOSIS — H25813 Combined forms of age-related cataract, bilateral: Secondary | ICD-10-CM | POA: Diagnosis not present

## 2020-07-20 DIAGNOSIS — M545 Low back pain, unspecified: Secondary | ICD-10-CM | POA: Diagnosis not present

## 2020-07-20 DIAGNOSIS — M25571 Pain in right ankle and joints of right foot: Secondary | ICD-10-CM | POA: Diagnosis not present

## 2020-07-23 DIAGNOSIS — N950 Postmenopausal bleeding: Secondary | ICD-10-CM | POA: Diagnosis not present

## 2020-07-23 DIAGNOSIS — Z124 Encounter for screening for malignant neoplasm of cervix: Secondary | ICD-10-CM | POA: Diagnosis not present

## 2020-07-23 DIAGNOSIS — E119 Type 2 diabetes mellitus without complications: Secondary | ICD-10-CM | POA: Diagnosis not present

## 2020-07-23 DIAGNOSIS — Z01419 Encounter for gynecological examination (general) (routine) without abnormal findings: Secondary | ICD-10-CM | POA: Diagnosis not present

## 2020-07-23 DIAGNOSIS — I10 Essential (primary) hypertension: Secondary | ICD-10-CM | POA: Diagnosis not present

## 2020-07-27 DIAGNOSIS — M79671 Pain in right foot: Secondary | ICD-10-CM | POA: Diagnosis not present

## 2020-07-27 DIAGNOSIS — M47896 Other spondylosis, lumbar region: Secondary | ICD-10-CM | POA: Diagnosis not present

## 2020-07-27 DIAGNOSIS — M25671 Stiffness of right ankle, not elsewhere classified: Secondary | ICD-10-CM | POA: Diagnosis not present

## 2020-07-27 DIAGNOSIS — M5416 Radiculopathy, lumbar region: Secondary | ICD-10-CM | POA: Diagnosis not present

## 2020-08-03 DIAGNOSIS — M79671 Pain in right foot: Secondary | ICD-10-CM | POA: Diagnosis not present

## 2020-08-03 DIAGNOSIS — M5416 Radiculopathy, lumbar region: Secondary | ICD-10-CM | POA: Diagnosis not present

## 2020-08-03 DIAGNOSIS — M25671 Stiffness of right ankle, not elsewhere classified: Secondary | ICD-10-CM | POA: Diagnosis not present

## 2020-08-03 DIAGNOSIS — M47896 Other spondylosis, lumbar region: Secondary | ICD-10-CM | POA: Diagnosis not present

## 2020-08-06 DIAGNOSIS — N950 Postmenopausal bleeding: Secondary | ICD-10-CM | POA: Diagnosis not present

## 2020-08-06 DIAGNOSIS — R9389 Abnormal findings on diagnostic imaging of other specified body structures: Secondary | ICD-10-CM | POA: Diagnosis not present

## 2020-08-24 DIAGNOSIS — R9389 Abnormal findings on diagnostic imaging of other specified body structures: Secondary | ICD-10-CM | POA: Diagnosis not present

## 2020-08-24 DIAGNOSIS — N950 Postmenopausal bleeding: Secondary | ICD-10-CM | POA: Diagnosis not present

## 2020-09-02 DIAGNOSIS — M25571 Pain in right ankle and joints of right foot: Secondary | ICD-10-CM | POA: Diagnosis not present

## 2020-09-07 DIAGNOSIS — M545 Low back pain, unspecified: Secondary | ICD-10-CM | POA: Diagnosis not present

## 2020-09-10 DIAGNOSIS — M545 Low back pain, unspecified: Secondary | ICD-10-CM | POA: Diagnosis not present

## 2020-09-22 DIAGNOSIS — M5416 Radiculopathy, lumbar region: Secondary | ICD-10-CM | POA: Diagnosis not present

## 2020-09-23 DIAGNOSIS — E1165 Type 2 diabetes mellitus with hyperglycemia: Secondary | ICD-10-CM | POA: Diagnosis not present

## 2020-09-23 DIAGNOSIS — E785 Hyperlipidemia, unspecified: Secondary | ICD-10-CM | POA: Diagnosis not present

## 2020-10-06 DIAGNOSIS — M5416 Radiculopathy, lumbar region: Secondary | ICD-10-CM | POA: Diagnosis not present

## 2020-10-07 DIAGNOSIS — J302 Other seasonal allergic rhinitis: Secondary | ICD-10-CM | POA: Diagnosis not present

## 2020-10-07 DIAGNOSIS — E1165 Type 2 diabetes mellitus with hyperglycemia: Secondary | ICD-10-CM | POA: Diagnosis not present

## 2020-10-07 DIAGNOSIS — E119 Type 2 diabetes mellitus without complications: Secondary | ICD-10-CM | POA: Diagnosis not present

## 2020-10-07 DIAGNOSIS — E785 Hyperlipidemia, unspecified: Secondary | ICD-10-CM | POA: Diagnosis not present

## 2020-10-07 DIAGNOSIS — Z1211 Encounter for screening for malignant neoplasm of colon: Secondary | ICD-10-CM | POA: Diagnosis not present

## 2020-10-07 DIAGNOSIS — B372 Candidiasis of skin and nail: Secondary | ICD-10-CM | POA: Diagnosis not present

## 2020-10-07 DIAGNOSIS — N183 Chronic kidney disease, stage 3 unspecified: Secondary | ICD-10-CM | POA: Diagnosis not present

## 2020-10-07 DIAGNOSIS — E1140 Type 2 diabetes mellitus with diabetic neuropathy, unspecified: Secondary | ICD-10-CM | POA: Diagnosis not present

## 2020-10-21 DIAGNOSIS — D235 Other benign neoplasm of skin of trunk: Secondary | ICD-10-CM | POA: Diagnosis not present

## 2020-10-21 DIAGNOSIS — L729 Follicular cyst of the skin and subcutaneous tissue, unspecified: Secondary | ICD-10-CM | POA: Diagnosis not present

## 2020-10-21 DIAGNOSIS — Z7984 Long term (current) use of oral hypoglycemic drugs: Secondary | ICD-10-CM | POA: Diagnosis not present

## 2020-10-21 DIAGNOSIS — Z79899 Other long term (current) drug therapy: Secondary | ICD-10-CM | POA: Diagnosis not present

## 2020-10-21 DIAGNOSIS — L720 Epidermal cyst: Secondary | ICD-10-CM | POA: Diagnosis not present

## 2020-10-21 DIAGNOSIS — L723 Sebaceous cyst: Secondary | ICD-10-CM | POA: Diagnosis not present

## 2020-10-28 DIAGNOSIS — H25813 Combined forms of age-related cataract, bilateral: Secondary | ICD-10-CM | POA: Diagnosis not present

## 2020-10-28 DIAGNOSIS — E119 Type 2 diabetes mellitus without complications: Secondary | ICD-10-CM | POA: Diagnosis not present

## 2020-10-28 DIAGNOSIS — H401131 Primary open-angle glaucoma, bilateral, mild stage: Secondary | ICD-10-CM | POA: Diagnosis not present

## 2020-12-02 DIAGNOSIS — Z1231 Encounter for screening mammogram for malignant neoplasm of breast: Secondary | ICD-10-CM | POA: Diagnosis not present

## 2020-12-30 DIAGNOSIS — E1165 Type 2 diabetes mellitus with hyperglycemia: Secondary | ICD-10-CM | POA: Diagnosis not present

## 2020-12-30 DIAGNOSIS — I10 Essential (primary) hypertension: Secondary | ICD-10-CM | POA: Diagnosis not present

## 2020-12-30 DIAGNOSIS — E785 Hyperlipidemia, unspecified: Secondary | ICD-10-CM | POA: Diagnosis not present

## 2020-12-30 DIAGNOSIS — K219 Gastro-esophageal reflux disease without esophagitis: Secondary | ICD-10-CM | POA: Diagnosis not present

## 2020-12-30 DIAGNOSIS — E1140 Type 2 diabetes mellitus with diabetic neuropathy, unspecified: Secondary | ICD-10-CM | POA: Diagnosis not present

## 2021-01-13 DIAGNOSIS — M5416 Radiculopathy, lumbar region: Secondary | ICD-10-CM | POA: Diagnosis not present

## 2021-01-23 IMAGING — DX DG CHEST 1V PORT
1 series · 1 of 1 positions shown · non-contrast
Comparison: September 04, 2003

CLINICAL DATA: Cough.

EXAM:
PORTABLE CHEST 1 VIEW

[chest ap]
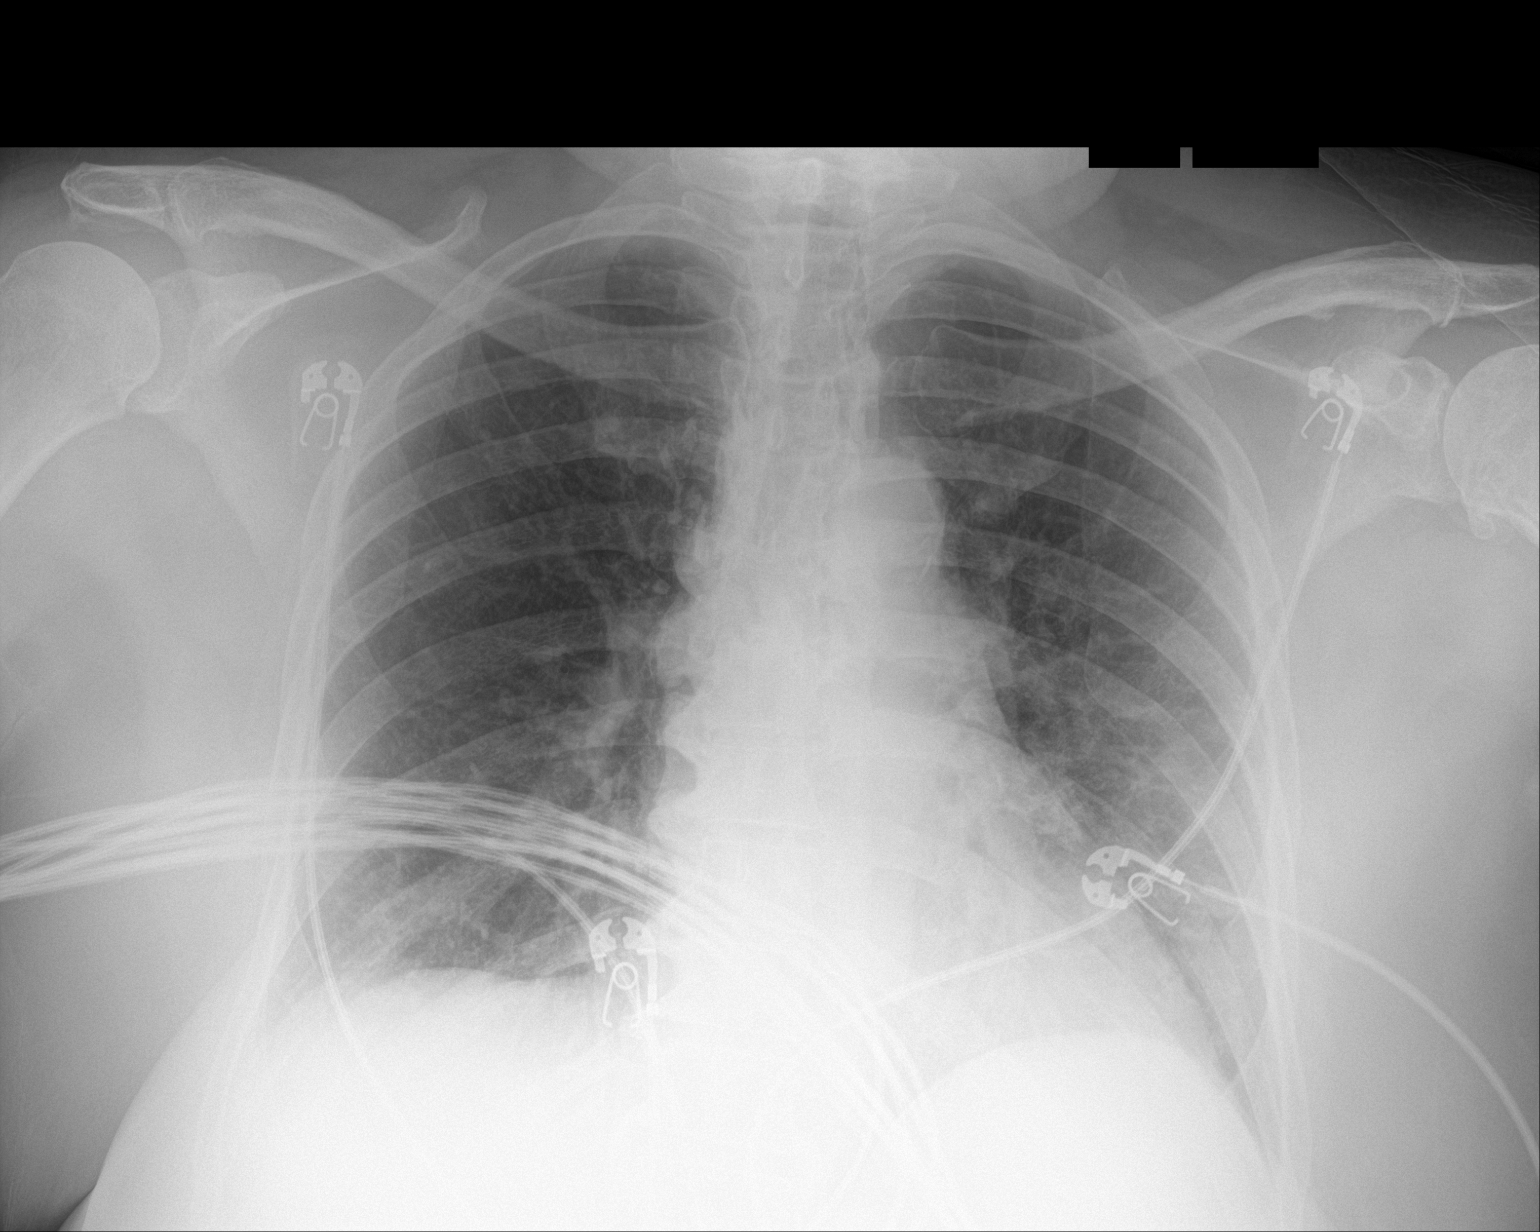

[1 of 1 positions shown; findings below may reference images not displayed]

FINDINGS: There are few subtle scattered airspace opacities bilaterally. There
is no pneumothorax or large pleural effusion. The heart size is
normal. Aortic calcifications are noted. There are degenerative
changes of the left glenohumeral joint.
IMPRESSION: Subtle scattered airspace opacities which can be seen in patients
with viral pneumonia.

## 2021-01-26 DIAGNOSIS — M5416 Radiculopathy, lumbar region: Secondary | ICD-10-CM | POA: Diagnosis not present

## 2021-02-26 DIAGNOSIS — H25813 Combined forms of age-related cataract, bilateral: Secondary | ICD-10-CM | POA: Diagnosis not present

## 2021-02-26 DIAGNOSIS — H401123 Primary open-angle glaucoma, left eye, severe stage: Secondary | ICD-10-CM | POA: Diagnosis not present

## 2021-02-26 DIAGNOSIS — E119 Type 2 diabetes mellitus without complications: Secondary | ICD-10-CM | POA: Diagnosis not present

## 2021-02-26 DIAGNOSIS — H401112 Primary open-angle glaucoma, right eye, moderate stage: Secondary | ICD-10-CM | POA: Diagnosis not present

## 2021-03-01 DIAGNOSIS — M5416 Radiculopathy, lumbar region: Secondary | ICD-10-CM | POA: Diagnosis not present

## 2021-03-03 DIAGNOSIS — M5416 Radiculopathy, lumbar region: Secondary | ICD-10-CM | POA: Diagnosis not present

## 2021-03-03 DIAGNOSIS — M6281 Muscle weakness (generalized): Secondary | ICD-10-CM | POA: Diagnosis not present

## 2021-03-03 DIAGNOSIS — M4804 Spinal stenosis, thoracic region: Secondary | ICD-10-CM | POA: Diagnosis not present

## 2021-03-08 DIAGNOSIS — M5416 Radiculopathy, lumbar region: Secondary | ICD-10-CM | POA: Diagnosis not present

## 2021-10-22 ENCOUNTER — Encounter: Payer: Self-pay | Admitting: Gastroenterology

## 2021-11-01 ENCOUNTER — Ambulatory Visit (AMBULATORY_SURGERY_CENTER): Payer: Self-pay

## 2021-11-01 VITALS — Ht 62.5 in | Wt 257.0 lb

## 2021-11-01 DIAGNOSIS — Z1211 Encounter for screening for malignant neoplasm of colon: Secondary | ICD-10-CM

## 2021-11-01 MED ORDER — NA SULFATE-K SULFATE-MG SULF 17.5-3.13-1.6 GM/177ML PO SOLN: 1.0000 | ORAL | 0 refills | Status: DC

## 2021-11-01 NOTE — Progress Notes (Signed)
No egg or soy allergy known to patient  No issues known to pt with past sedation with any surgeries or procedures Patient denies ever being told they had issues or difficulty with intubation  No FH of Malignant Hyperthermia Pt is not on diet pills Pt is not on  home 02  Pt is not on blood thinners  Pt reports periodic issues with constipation. She has not resumed her Amitiza rx. No A fib or A flutter Have any cardiac testing pending--denied Pt instructed to use [email or web address] or GoodRx for a price reduction on prep    Discussed local pharmacy vs mail order and [facility]  vs Suprep insurance coverage. Pt wishes to proceed with [facility] mail order and Suprep, but will call if Suprep needs to be sent to local pharmacy that honors GoodRx.

## 2021-11-20 ENCOUNTER — Encounter: Payer: Self-pay | Admitting: Certified Registered Nurse Anesthetist

## 2021-11-26 ENCOUNTER — Ambulatory Visit (AMBULATORY_SURGERY_CENTER): Payer: Medicare PPO | Admitting: Gastroenterology

## 2021-11-26 ENCOUNTER — Encounter: Payer: Self-pay | Admitting: Gastroenterology

## 2021-11-26 VITALS — BP 142/93 | HR 83 | Temp 97.1°F | Resp 13 | Ht 62.5 in | Wt 257.0 lb

## 2021-11-26 DIAGNOSIS — Z1211 Encounter for screening for malignant neoplasm of colon: Secondary | ICD-10-CM

## 2021-11-26 DIAGNOSIS — D122 Benign neoplasm of ascending colon: Secondary | ICD-10-CM | POA: Diagnosis not present

## 2021-11-26 MED ORDER — SODIUM CHLORIDE 0.9 % IV SOLN: 500.0000 mL | Freq: Once | INTRAVENOUS | Status: DC

## 2021-11-26 NOTE — Progress Notes (Unsigned)
Report given to PACU, vss 

## 2021-11-26 NOTE — Op Note (Signed)
[place] Patient Name: Carrie Solis Procedure Date: 11/26/2021 11:49 AM MRN: [id number] Endoscopist: Mauri Pole , MD Age: 71 Referring MD:  Date of Birth: 1950-05-14 Gender: Female Account #: [id number] Procedure:                Colonoscopy Indications:              Screening for colorectal malignant neoplasm Medicines:                Monitored Anesthesia Care Procedure:                Pre-Anesthesia Assessment:                           - Prior to the procedure, a History and Physical                            was performed, and patient medications and                            allergies were reviewed. The patient's tolerance of                            previous anesthesia was also reviewed. The risks                            and benefits of the procedure and the sedation                            options and risks were discussed with the patient.                            All questions were answered, and informed consent                            was obtained. Prior Anticoagulants: The patient has                            taken no previous anticoagulant or antiplatelet                            agents. ASA Grade Assessment: III - A patient with                            severe systemic disease. After reviewing the risks                            and benefits, the patient was deemed in                            satisfactory condition to undergo the procedure.                           After obtaining informed consent, the colonoscope  was passed under direct vision. Throughout the                            procedure, the patient's blood pressure, pulse, and                            oxygen saturations were monitored continuously. The                            PCF-HQ190L Colonoscope was introduced through the                            anus and advanced to the the cecum, identified by                             appendiceal orifice and ileocecal valve. The                            colonoscopy was performed without difficulty. The                            patient tolerated the procedure well. The quality                            of the bowel preparation was excellent. The                            ileocecal valve, appendiceal orifice, and rectum                            were photographed. Scope In: 11:59:39 AM Scope Out: 12:12:43 PM Scope Withdrawal Time: 0 hours 9 minutes 35 seconds  Total Procedure Duration: 0 hours 13 minutes 4 seconds  Findings:                 The perianal and digital rectal examinations were                            normal.                           A 10 mm polyp was found in the ascending colon. The                            polyp was semi-pedunculated. The polyp was removed                            with a hot snare. Resection and retrieval were                            complete.                           A 3 mm polyp was found in the ascending colon. The  polyp was sessile. The polyp was removed with a                            cold snare. Resection and retrieval were complete.                           Scattered small and large-mouthed diverticula were                            found in the sigmoid colon, transverse colon and                            ascending colon.                           Non-bleeding external and internal hemorrhoids were                            found during retroflexion. The hemorrhoids were                            medium-sized. Complications:            No immediate complications. Estimated Blood Loss:     Estimated blood loss was minimal. Impression:               - One 10 mm polyp in the ascending colon, removed                            with a hot snare. Resected and retrieved.                           - One 3 mm polyp in the ascending colon, removed                            with a cold  snare. Resected and retrieved.                           - Diverticulosis in the sigmoid colon, in the                            transverse colon and in the ascending colon.                           - Non-bleeding external and internal hemorrhoids. Recommendation:           - Patient has a contact number available for                            emergencies. The signs and symptoms of potential                            delayed complications were discussed with the  patient. Return to normal activities tomorrow.                            Written discharge instructions were provided to the                            patient.                           - Resume previous diet.                           - Continue present medications.                           - Await pathology results.                           - Repeat colonoscopy in 5-10 years for surveillance                            based on pathology results. Mauri Pole, MD 11/26/2021 12:27:27 PM This report has been signed electronically.

## 2021-11-26 NOTE — Progress Notes (Signed)
Called to room to assist during endoscopic procedure.  Patient ID and intended procedure confirmed with present staff. Received instructions for my participation in the procedure from the performing physician.  

## 2021-11-26 NOTE — Progress Notes (Unsigned)
1210 Robinul 0.2 mg IV given due large amount of secretions upon assessment.  MD made aware, vss

## 2021-11-26 NOTE — Patient Instructions (Signed)
Thank you for coming in to see [place] today! Resume your regular medications today. Return to regular activities tomorrow.  Recommend you speak with your physician about replacing your CPAP machine.    YOU HAD AN ENDOSCOPIC PROCEDURE TODAY AT [facility]:   Refer to the procedure report that was given to you for any specific questions about what was found during the examination.  If the procedure report does not answer your questions, please call your gastroenterologist to clarify.  If you requested that your care partner not be given the details of your procedure findings, then the procedure report has been included in a sealed envelope for you to review at your convenience later.  YOU SHOULD EXPECT: Some feelings of bloating in the abdomen. Passage of more gas than usual.  Walking can help get rid of the air that was put into your GI tract during the procedure and reduce the bloating. If you had a lower endoscopy (such as a colonoscopy or flexible sigmoidoscopy) you may notice spotting of blood in your stool or on the toilet paper. If you underwent a bowel prep for your procedure, you may not have a normal bowel movement for a few days.  Please Note:  You might notice some irritation and congestion in your nose or some drainage.  This is from the oxygen used during your procedure.  There is no need for concern and it should clear up in a day or so.  SYMPTOMS TO REPORT IMMEDIATELY:  Following lower endoscopy (colonoscopy or flexible sigmoidoscopy):  Excessive amounts of blood in the stool  Significant tenderness or worsening of abdominal pains  Swelling of the abdomen that is new, acute  Fever of 100F or higher   For urgent or emergent issues, a gastroenterologist can be reached at any hour by calling [phone]. Do not use MyChart messaging for urgent concerns.    DIET:  We do recommend a small meal at first, but then you may proceed to your regular diet.  Drink plenty  of fluids but you should avoid alcoholic beverages for 24 hours.  ACTIVITY:  You should plan to take it easy for the rest of today and you should NOT DRIVE or use heavy machinery until tomorrow (because of the sedation medicines used during the test).    FOLLOW UP: Our staff will call the number listed on your records the next business day following your procedure.  We will call around 7:15- 8:00 am to check on you and address any questions or concerns that you may have regarding the information given to you following your procedure. If we do not reach you, we will leave a message.     If any biopsies were taken you will be contacted by phone or by letter within the next 1-3 weeks.  Please call us at [phone] if you have not heard about the biopsies in 3 weeks.    SIGNATURES/CONFIDENTIALITY: You and/or your care partner have signed paperwork which will be entered into your electronic medical record.  These signatures attest to the fact that that the information above on your After Visit Summary has been reviewed and is understood.  Full responsibility of the confidentiality of this discharge information lies with you and/or your care-partner.

## 2021-11-26 NOTE — Progress Notes (Unsigned)
[facility] History and Physical   Primary Care Physician:  Vonna Drafts, FNP   Reason for Procedure:  Colorectal cancer screening  Plan:    Screening colonoscopy with possible interventions as needed     HPI: Carrie Solis is a very pleasant 71 y.o. female here for screening colonoscopy. Denies any nausea, vomiting, abdominal pain, melena or bright red blood per rectum  The risks and benefits as well as alternatives of endoscopic procedure(s) have been discussed and reviewed. All questions answered. The patient agrees to proceed.    Past Medical History:  Diagnosis Date   ANXIETY DISORDER 07/17/2009   Asthma    Chronic kidney disease    Essential hypertension, benign 07/17/2009   GERD 09/17/2009   HYPOTHYROIDISM 09/17/2009   IDDM 09/17/2009   Sleep apnea     Past Surgical History:  Procedure Laterality Date   CESAREAN SECTION     KNEE ARTHROSCOPY Right     Prior to Admission medications   Medication Sig Start Date End Date Taking? Authorizing Provider  cetirizine (ZYRTEC) 10 MG tablet Take 10 mg by mouth daily.   Yes [provider]  Continuous Blood Gluc Sensor (FREESTYLE LIBRE 14 DAY SENSOR) MISC Inject 1 patch into the skin every 14 (fourteen) days. 01/28/19  Yes [provider]  Continuous Blood Gluc Sensor (FREESTYLE LIBRE 14 DAY SENSOR) MISC Inject into the skin. 10/02/21  Yes [provider]  dapagliflozin propanediol (FARXIGA) 10 MG TABS tablet Take 10 mg by mouth daily.   Yes [provider]  dorzolamide-timolol (COSOPT) 2-0.5 % ophthalmic solution 1 drop 2 (two) times daily. 11/25/21  Yes [provider]  esomeprazole (NEXIUM) 40 MG capsule Take 40 mg by mouth daily.    Yes [provider]  gabapentin (NEURONTIN) 600 MG tablet Take by mouth.   Yes [provider]  glucose blood (FREESTYLE LITE) test strip Two times a day dx 250.01 09/27/11  Yes Renato Shin, MD  rosuvastatin  (CRESTOR) 10 MG tablet Take 10 mg by mouth daily.     Yes [provider]  Semaglutide, 2 MG/DOSE, (OZEMPIC, 2 MG/DOSE,) 8 MG/3ML SOPN Inject 2 mg every week by subcutaneous route as needed for 90 days. 10/26/21  Yes [provider]  Travoprost, BAK Free, (TRAVATAN) 0.004 % SOLN ophthalmic solution 1 drop at bedtime. 11/25/21  Yes [provider]  valsartan-hydrochlorothiazide (DIOVAN-HCT) 160-12.5 MG tablet Take 1 tablet by mouth daily.    Yes [provider]  albuterol (PROVENTIL) 90 MCG/ACT inhaler Inhale 2 puffs into the lungs as needed for shortness of breath.  Patient not taking: Reported on 11/01/2021    [provider]  budesonide-formoterol (SYMBICORT) 160-4.5 MCG/ACT inhaler Inhale 2 puffs into the lungs daily as needed (congestion).  Patient not taking: Reported on 11/01/2021    [provider]  gabapentin (NEURONTIN) 600 MG tablet Take 600 mg by mouth daily. 09/17/21   [provider]  insulin glargine (LANTUS SOLOSTAR) 100 UNIT/ML Solostar Pen Inject 30 units every day by subcutaneous route in the evening for 90 days. 01/07/21   [provider]  insulin regular (NOVOLIN R) 100 units/mL injection Use as sliding scale insuline for BS over 180 01/07/21   [provider]  montelukast (SINGULAIR) 10 MG tablet Take 10 mg by mouth daily as needed (allergies).  Patient not taking: Reported on 11/01/2021    [provider]    Current Outpatient Medications  Medication Sig Dispense Refill   cetirizine (ZYRTEC)  10 MG tablet Take 10 mg by mouth daily.     Continuous Blood Gluc Sensor (FREESTYLE LIBRE 14 DAY SENSOR) MISC Inject 1 patch into the skin every 14 (fourteen) days.     Continuous Blood Gluc Sensor (FREESTYLE LIBRE 14 DAY SENSOR) MISC Inject into the skin.     dapagliflozin propanediol (FARXIGA) 10 MG TABS tablet Take 10 mg by mouth daily.     dorzolamide-timolol (COSOPT) 2-0.5 % ophthalmic solution 1  drop 2 (two) times daily.     esomeprazole (NEXIUM) 40 MG capsule Take 40 mg by mouth daily.      gabapentin (NEURONTIN) 600 MG tablet Take by mouth.     glucose blood (FREESTYLE LITE) test strip Two times a day dx 250.01 180 each 3   rosuvastatin (CRESTOR) 10 MG tablet Take 10 mg by mouth daily.       Semaglutide, 2 MG/DOSE, (OZEMPIC, 2 MG/DOSE,) 8 MG/3ML SOPN Inject 2 mg every week by subcutaneous route as needed for 90 days.     Travoprost, BAK Free, (TRAVATAN) 0.004 % SOLN ophthalmic solution 1 drop at bedtime.     valsartan-hydrochlorothiazide (DIOVAN-HCT) 160-12.5 MG tablet Take 1 tablet by mouth daily.      albuterol (PROVENTIL) 90 MCG/ACT inhaler Inhale 2 puffs into the lungs as needed for shortness of breath.  (Patient not taking: Reported on 11/01/2021)     budesonide-formoterol (SYMBICORT) 160-4.5 MCG/ACT inhaler Inhale 2 puffs into the lungs daily as needed (congestion).  (Patient not taking: Reported on 11/01/2021)     gabapentin (NEURONTIN) 600 MG tablet Take 600 mg by mouth daily.     insulin glargine (LANTUS SOLOSTAR) 100 UNIT/ML Solostar Pen Inject 30 units every day by subcutaneous route in the evening for 90 days.     insulin regular (NOVOLIN R) 100 units/mL injection Use as sliding scale insuline for BS over 180     montelukast (SINGULAIR) 10 MG tablet Take 10 mg by mouth daily as needed (allergies).  (Patient not taking: Reported on 11/01/2021)     Current Facility-Administered Medications  Medication Dose Route Frequency Provider Last Rate Last Admin   0.9 %  sodium chloride infusion  500 mL Intravenous Once Mauri Pole, MD        Allergies as of 11/26/2021 - Review Complete 11/26/2021  Allergen Reaction Noted   Sulfonamide derivatives  07/17/2009    Family History  Problem Relation Age of Onset   Diabetes Mother    Heart disease Mother    Heart disease Father    Heart disease Sister    Diabetes Brother    Kidney disease Brother        on dialysis   Colon  cancer Neg Hx    Colon polyps Neg Hx    Esophageal cancer Neg Hx    Rectal cancer Neg Hx    Stomach cancer Neg Hx     Social History   Socioeconomic History   Marital status: Married    Spouse name: Not on file   Number of children: Not on file   Years of education: Not on file   Highest education level: Not on file  Occupational History   Occupation: Education officer, museum  Tobacco Use   Smoking status: Never   Smokeless tobacco: Never  Substance and Sexual Activity   Alcohol use: Yes   Drug use: Not on file   Sexual activity: Not Currently  Other Topics Concern   Not on file  Social History Narrative   Not  on file   Social Determinants of Health   Financial Resource Strain: Not on file  Food Insecurity: Not on file  Transportation Needs: Not on file  Physical Activity: Not on file  Stress: Not on file  Social Connections: Not on file  Intimate Partner Violence: Not on file    Review of Systems:  All other review of systems negative except as mentioned in the HPI.  Physical Exam: Vital signs in last 24 hours: Blood Pressure 103/63   Pulse 71   Temperature (Abnormal) 97.1 F (36.2 C)   Height 5' 2.5" (1.588 m)   Weight 257 lb (116.6 kg)   Oxygen Saturation 98%   Body Mass Index 46.26 kg/m  General:   Alert, NAD Lungs:  Clear .   Heart:  Regular rate and rhythm Abdomen:  Soft, nontender and nondistended. Neuro/Psych:  Alert and cooperative. Normal mood and affect. A and O x 3  Reviewed labs, radiology imaging, old records and pertinent past GI work up  Patient is appropriate for planned procedure(s) and anesthesia in an ambulatory setting   K. Denzil Magnuson , MD [phone]

## 2021-11-26 NOTE — Progress Notes (Unsigned)
Pt's states no medical or surgical changes since previsit or office visit. 

## 2021-11-29 ENCOUNTER — Telehealth: Payer: Self-pay

## 2021-11-29 NOTE — Telephone Encounter (Signed)
Left message on answering machine. 

## 2021-12-12 ENCOUNTER — Encounter: Payer: Self-pay | Admitting: Gastroenterology

## 2022-04-25 ENCOUNTER — Other Ambulatory Visit (HOSPITAL_COMMUNITY): Payer: Self-pay

## 2022-04-25 MED ORDER — MOUNJARO 10 MG/0.5ML ~~LOC~~ SOAJ
10.0000 mg | SUBCUTANEOUS | 3 refills | Status: AC
  Filled 2022-04-25: qty 2, 28d supply, fill #0

## 2022-04-26 ENCOUNTER — Other Ambulatory Visit (HOSPITAL_COMMUNITY): Payer: Self-pay

## 2022-05-06 ENCOUNTER — Other Ambulatory Visit (HOSPITAL_COMMUNITY): Payer: Self-pay

## 2022-11-24 ENCOUNTER — Other Ambulatory Visit: Payer: Self-pay

## 2023-01-17 ENCOUNTER — Other Ambulatory Visit (HOSPITAL_BASED_OUTPATIENT_CLINIC_OR_DEPARTMENT_OTHER): Payer: Self-pay

## 2023-01-17 DIAGNOSIS — G4733 Obstructive sleep apnea (adult) (pediatric): Secondary | ICD-10-CM

## 2023-12-27 ENCOUNTER — Other Ambulatory Visit (HOSPITAL_BASED_OUTPATIENT_CLINIC_OR_DEPARTMENT_OTHER): Payer: Self-pay

## 2024-03-15 ENCOUNTER — Encounter (HOSPITAL_BASED_OUTPATIENT_CLINIC_OR_DEPARTMENT_OTHER): Payer: Self-pay | Admitting: Pulmonary Disease
